# Patient Record
Sex: Female | Born: 1970 | Hispanic: Refuse to answer | Marital: Single
Health system: Midwestern US, Community
[De-identification: ages and names within clinical notes are randomized; demographics above are authoritative.]

## PROBLEM LIST (undated history)

## (undated) DIAGNOSIS — E278 Other specified disorders of adrenal gland: Secondary | ICD-10-CM

## (undated) DIAGNOSIS — J45909 Unspecified asthma, uncomplicated: Secondary | ICD-10-CM

## (undated) DIAGNOSIS — G459 Transient cerebral ischemic attack, unspecified: Secondary | ICD-10-CM

## (undated) DIAGNOSIS — I671 Cerebral aneurysm, nonruptured: Secondary | ICD-10-CM

## (undated) DIAGNOSIS — M79605 Pain in left leg: Principal | ICD-10-CM

## (undated) HISTORY — PX: CARDIAC CATHETERIZATION: SHX172

## (undated) HISTORY — PX: CHOLECYSTECTOMY: SHX55

---

## 2014-08-04 ENCOUNTER — Emergency Department: Admit: 2014-08-05 | Payer: Self-pay

## 2014-08-04 ENCOUNTER — Inpatient Hospital Stay
Admit: 2014-08-04 | Discharge: 2014-08-05 | Disposition: A | Discharge status: Home / Self Care | Payer: Self-pay | Attending: Emergency Medicine

## 2014-08-04 DIAGNOSIS — M769 Unspecified enthesopathy, lower limb, excluding foot: Secondary | ICD-10-CM

## 2014-08-04 NOTE — ED Notes (Signed)
C/o R hip pain x2 days, worsened this AM when rolling over in bed felt pop in R hip

## 2014-08-04 NOTE — ED Provider Notes (Signed)
HPI Comments:   8:43 PM   Judy Campbell is a 43 y.o. female presenting to the ED C/O R hip pain onset 2 days ago.   "It feels like it's on fire." Pt says rotating the R hip worsens the pain. Notes that when she is walking, she experiences a "popping" sensation. 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 Reports of working for the past 2 days. Pt states of taking Motrin 600mg  from her doctor in [place] for general pain. Pt denies abd pain, hx of appendicitis, trauma, and any other Sx or complaints.        Patient is a 43 y.o. female presenting with hip pain. The history is provided by the patient. No language interpreter was used.   Hip Injury   This is a new problem. The current episode started 2 days ago. The problem has not changed since onset.The pain is present in the right hip.        Past Medical History:   Diagnosis Date   ??? DVT (deep venous thrombosis) (HCC)      L calf   ??? PE (pulmonary embolism) (HCC)    ??? MI (myocardial infarction) (HCC)    ??? Stroke [facility])        Past Surgical History:   Procedure Laterality Date   ??? Hx heart catheterization       2000,2011   ??? Hx cesarean section       1991   ??? Hx pelvic laparoscopy       4         History reviewed. No pertinent family history.    History     Social History   ??? Marital Status: SINGLE     Spouse Name: N/A     Number of Children: N/A   ??? Years of Education: N/A     Occupational History   ??? Not on file.     Social History Main Topics   ??? Smoking status: Never Smoker    ??? Smokeless tobacco: Not on file   ??? Alcohol Use: Not on file   ??? Drug Use: Not on file   ??? Sexual Activity: Not on file     Other Topics Concern   ??? Not on file     Social History Narrative   ??? No narrative on file     ALLERGIES: Amoxicillin and Bactrim      Review of Systems   Constitutional: Negative for fever and fatigue.   HENT: Negative for rhinorrhea and sore throat.    Respiratory: Negative for cough and shortness of breath.     Cardiovascular: Negative for chest pain and palpitations.   Gastrointestinal: Negative for nausea, vomiting, abdominal pain and diarrhea.   Genitourinary: Negative for dysuria and difficulty urinating.   Musculoskeletal: Positive for myalgias and arthralgias.   Skin: Negative for color change and rash.   Neurological: Negative for light-headedness and headaches.   All other systems reviewed and are negative.      Filed Vitals:    08/04/14 1914   BP: 121/79   Pulse: 79   Temp: 98.3 ??F (36.8 ??C)   Resp: 14   Height: 5\' 4"  (1.626 m)   Weight: 83.915 kg (185 lb)   SpO2: 97%            Physical Exam   Constitutional: She appears well-developed and well-nourished. No distress.   Neck: Normal range of motion.  Cardiovascular: Normal rate.    Pulmonary/Chest: Effort normal and breath sounds normal.   Abdominal: Soft. There is no tenderness.   Musculoskeletal:        Right hip: She exhibits tenderness and deformity. She exhibits normal range of motion, normal strength, no bony tenderness and no swelling.   Pain increases with abduction and flexion as well as internal rotation.   Neurological: No cranial nerve deficit or sensory deficit.   Skin: No rash noted.   Nursing note and vitals reviewed.       RESULTS:    8:37 PM  RADIOLOGY FINDINGS  RT HIP X-ray shows nothing acute  Pending review by Radiologist  Recorded by Farrell Ourshelsea Rubis, ED Scribe, as dictated by Osborne OmanSarah Danity Schmelzer, PA-C      XR HIP RT AP/LAT MIN 2 V    (Results Pending)        Labs Reviewed - No data to display    No results found for this or any previous visit (from the past 12 hour(s)).     MDM    MEDICATIONS GIVEN:  Medications   HYDROcodone-acetaminophen (NORCO) 5-325 mg per tablet 1 Tab (1 Tab Oral Given 08/04/14 2057)        Procedures       PROGRESS NOTE:  8:26 PM   Initial assessment performed.  Written by Farrell Ourshelsea Rubis, ED Scribe, as dictated by Osborne OmanSarah Bali Lyn, PA-C     DISCHARGE NOTE:   8:51 PM     Neoma LamingHolly Diane Crutcher's results have been reviewed with patient and/or family. Patient and/or family has been counseled regarding diagnosis, treatment, and plan.  Patient and/or family verbally conveys understanding and agreement of the signs, symptoms, diagnosis, treatment and prognosis and additionally agrees to follow up as discussed.  Patient and/or family also agrees with the care-plan and conveys that all of his/her questions have been answered.  I have also provided discharge instructions for the patient and/or family that include: educational information regarding their diagnosis and treatment, and list of reasons why they would want to return to the ED prior to their follow-up appointment, should patient's condition change.     CLINICAL IMPRESSION    1. Hip flexor tendonitis, right           AFTER VISIT PLAN    Follow-up Information     Follow up With Details Comments Contact Info    Luna GlasgowBoyd W Haynes III, MD Call in 1 day orthopedist [address]  [place] [id number]  [phone]      [facility]  As needed, If symptoms worsen [address]  [place] [id number]  [phone]          Discharge Medication List as of 08/04/2014  8:53 PM      START taking these medications    Details   baclofen (LIORESAL) 10 mg tablet Take 1 Tab by mouth three (3) times daily., Print, Disp-20 Tab, R-0      HYDROcodone-acetaminophen (NORCO) 5-325 mg per tablet Take 1 Tab by mouth every six (6) hours as needed for Pain. Max Daily Amount: 4 Tabs., Print, Disp-20 Tab, R-0         CONTINUE these medications which have CHANGED    Details   ibuprofen (MOTRIN) 600 mg tablet Take 1 Tab by mouth every six (6) hours as needed for Pain., Print, Disp-30 Tab, R-0         CONTINUE these medications which have NOT CHANGED    Details  albuterol (PROVENTIL HFA, VENTOLIN HFA, PROAIR HFA) 90 mcg/actuation inhaler Take  by inhalation., Historical Med      RANITIDINE HCL PO Take  by mouth., Historical Med       aspirin 81 mg chewable tablet Take 81 mg by mouth daily., Historical Med             Written by Farrell Ourshelsea Rubis, ED Scribe, as dictated by Osborne OmanSarah Daisia Slomski, PA-C     I agree with the above documentation as written by the scribe.  Osborne OmanSarah Jayshaun Phillips, PA-C

## 2014-08-04 NOTE — ED Notes (Signed)
Pain level at discharge 5/10.  Pt discharged with family member.  Reviewed discharged instructions with patient who verbalized understanding.  Patient armband removed and shredded

## 2014-08-04 NOTE — ED Notes (Signed)
Patient reports pain in R hip since this morning, patient has husband at bedside, A&Ox4, respirations even and unlabored.

## 2014-08-05 MED ORDER — HYDROCODONE-ACETAMINOPHEN 5 MG-325 MG TAB
5-325 mg | ORAL_TABLET | Freq: Four times a day (QID) | ORAL | Status: DC | PRN
Start: 2014-08-05 — End: 2014-09-10

## 2014-08-05 MED ORDER — HYDROCODONE-ACETAMINOPHEN 5 MG-325 MG TAB
5-325 mg | ORAL | Status: AC
Start: 2014-08-05 — End: 2014-08-04
  Administered 2014-08-05: 02:00:00 via ORAL

## 2014-08-05 MED ORDER — BACLOFEN 10 MG TAB
10 mg | ORAL_TABLET | Freq: Three times a day (TID) | ORAL | Status: DC
Start: 2014-08-05 — End: 2014-09-10

## 2014-08-05 MED ORDER — IBUPROFEN 600 MG TAB
600 mg | ORAL_TABLET | Freq: Four times a day (QID) | ORAL | Status: DC | PRN
Start: 2014-08-05 — End: 2014-09-10

## 2014-08-05 MED FILL — HYDROCODONE-ACETAMINOPHEN 5 MG-325 MG TAB: 5-325 mg | ORAL | Qty: 1

## 2014-09-10 ENCOUNTER — Inpatient Hospital Stay
Admit: 2014-09-10 | Discharge: 2014-09-10 | Disposition: A | Discharge status: Home / Self Care | Payer: Self-pay | Attending: Emergency Medicine

## 2014-09-10 ENCOUNTER — Emergency Department: Admit: 2014-09-10 | Payer: Self-pay

## 2014-09-10 DIAGNOSIS — R109 Unspecified abdominal pain: Secondary | ICD-10-CM

## 2014-09-10 LAB — CBC WITH AUTOMATED DIFF
ABS. BASOPHILS: 0 10*3/uL (ref 0.0–0.06)
ABS. EOSINOPHILS: 0.1 10*3/uL (ref 0.0–0.4)
ABS. LYMPHOCYTES: 1.5 10*3/uL (ref 0.9–3.6)
ABS. MONOCYTES: 0.3 10*3/uL (ref 0.05–1.2)
ABS. NEUTROPHILS: 4.5 10*3/uL (ref 1.8–8.0)
BASOPHILS: 1 % (ref 0–2)
EOSINOPHILS: 1 % (ref 0–5)
HCT: 42.7 % (ref 35.0–45.0)
HGB: 14.8 g/dL (ref 12.0–16.0)
LYMPHOCYTES: 23 % (ref 21–52)
MCH: 29.7 PG (ref 24.0–34.0)
MCHC: 34.7 g/dL (ref 31.0–37.0)
MCV: 85.7 FL (ref 74.0–97.0)
MONOCYTES: 4 % (ref 3–10)
MPV: 9.1 FL — ABNORMAL LOW (ref 9.2–11.8)
NEUTROPHILS: 71 % (ref 40–73)
PLATELET: 311 10*3/uL (ref 135–420)
RBC: 4.98 M/uL (ref 4.20–5.30)
RDW: 12.4 % (ref 11.6–14.5)
WBC: 6.4 10*3/uL (ref 4.6–13.2)

## 2014-09-10 LAB — METABOLIC PANEL, COMPREHENSIVE
A-G Ratio: 1 (ref 0.8–1.7)
ALT (SGPT): 17 U/L (ref 13–56)
AST (SGOT): 7 U/L — ABNORMAL LOW (ref 15–37)
Albumin: 3.3 g/dL — ABNORMAL LOW (ref 3.4–5.0)
Alk. phosphatase: 49 U/L (ref 45–117)
Anion gap: 8 mmol/L (ref 3.0–18)
BUN/Creatinine ratio: 16 (ref 12–20)
BUN: 13 MG/DL (ref 7.0–18)
Bilirubin, total: 0.4 MG/DL (ref 0.2–1.0)
CO2: 27 mmol/L (ref 21–32)
Calcium: 8.2 MG/DL — ABNORMAL LOW (ref 8.5–10.1)
Chloride: 107 mmol/L (ref 100–108)
Creatinine: 0.79 MG/DL (ref 0.6–1.3)
GFR est AA: >60 mL/min/{1.73_m2} (ref >60)
GFR est non-AA: >60 mL/min/{1.73_m2} (ref >60)
Globulin: 3.2 g/dL (ref 2.0–4.0)
Glucose: 120 mg/dL — ABNORMAL HIGH (ref 74–99)
Potassium: 3.7 mmol/L (ref 3.5–5.5)
Protein, total: 6.5 g/dL (ref 6.4–8.2)
Sodium: 142 mmol/L (ref 136–145)

## 2014-09-10 LAB — URINALYSIS W/ RFLX MICROSCOPIC
Bilirubin: NEGATIVE
Blood: NEGATIVE
Glucose: NEGATIVE mg/dL
Ketone: NEGATIVE mg/dL
Leukocyte Esterase: NEGATIVE
Nitrites: NEGATIVE
Protein: NEGATIVE mg/dL
Specific gravity: 1.028 (ref 1.005–1.030)
Urobilinogen: 1 EU/dL (ref 0.2–1.0)
pH (UA): 7 (ref 5.0–8.0)

## 2014-09-10 LAB — LIPASE: Lipase: 110 U/L (ref 73–393)

## 2014-09-10 LAB — HCG URINE, QL: HCG urine, QL: NEGATIVE

## 2014-09-10 MED ORDER — TRAMADOL 50 MG TAB
50 mg | ORAL_TABLET | Freq: Four times a day (QID) | ORAL | Status: AC | PRN
Start: 2014-09-10 — End: ?

## 2014-09-10 MED ORDER — SODIUM CHLORIDE 0.9% BOLUS IV
0.9 % | Freq: Once | INTRAVENOUS | Status: AC
Start: 2014-09-10 — End: 2014-09-10
  Administered 2014-09-10: 14:00:00 via INTRAVENOUS

## 2014-09-10 MED ORDER — SODIUM CHLORIDE 0.9 % IJ SYRG
INTRAMUSCULAR | Status: DC | PRN
Start: 2014-09-10 — End: 2014-09-10

## 2014-09-10 MED ORDER — PANTOPRAZOLE 40 MG IV SOLR
40 mg | INTRAVENOUS | Status: AC
Start: 2014-09-10 — End: 2014-09-10
  Administered 2014-09-10: 16:00:00 via INTRAVENOUS

## 2014-09-10 MED ORDER — DICYCLOMINE 20 MG TAB
20 mg | ORAL_TABLET | Freq: Four times a day (QID) | ORAL | Status: AC
Start: 2014-09-10 — End: 2014-09-15

## 2014-09-10 MED ORDER — ONDANSETRON (PF) 4 MG/2 ML INJECTION
4 mg/2 mL | Freq: Once | INTRAMUSCULAR | Status: AC
Start: 2014-09-10 — End: 2014-09-10
  Administered 2014-09-10: 14:00:00 via INTRAVENOUS

## 2014-09-10 MED FILL — ONDANSETRON (PF) 4 MG/2 ML INJECTION: 4 mg/2 mL | INTRAMUSCULAR | Qty: 2

## 2014-09-10 MED FILL — PROTONIX 40 MG INTRAVENOUS SOLUTION: 40 mg | INTRAVENOUS | Qty: 40

## 2014-09-10 MED FILL — SODIUM CHLORIDE 0.9 % IV: INTRAVENOUS | Qty: 1000

## 2014-09-10 MED FILL — BD POSIFLUSH NORMAL SALINE 0.9 % INJECTION SYRINGE: INTRAMUSCULAR | Qty: 10

## 2014-09-10 NOTE — ED Notes (Addendum)
C/o right flank pain for 3 days, pain now radiates to upper abd area since yesterday, nausea yesterday, black stool this morning.  Sepsis Screening completed    (  )Patient meets SIRS criteria.  (x  )Patient does not meet SIRS criteria.      SIRS Criteria is achieved when two or more of the following are present  ? Temperature < 96.8??F (36??C) or > 100.9??F (38.3??C)  ? Heart Rate > 90 beats per minute  ? Respiratory Rate > 20 beats per minute  ? WBC count > 12,000 or <4,000 or > 10% bands      (  )Patient has a suspected source of infection.  (x  )Patient does not have a suspected source of infection.

## 2014-09-10 NOTE — ED Notes (Signed)
I have reviewed discharge instructions with the patient.  The patient verbalized understanding. Patient armband removed and shredded

## 2014-09-10 NOTE — ED Provider Notes (Signed)
HPI Comments:   8:58 AM   Judy Campbell is a 44 y.o. female presenting to the ED C/O cramping right-sided back pain x 3 days, radiating to the upper abdomen starting yesterday.  Pt also complains of nausea and vomiting 3 days ago (resolved now), fatigue, generalized weakness, chills, and black stool starting yesterday.  LMP started yesterday.  Pt was seen at [facility] recently for a pregnancy test (says it was negative).  PMHx includes GERD and diverticulitis.  Pt takes Ranitidine daily.  PSHx includes cholecystectomy and heart catheterization.  Pt denies use of cigarettes or EtOH; hematemesis, and any other symptoms or complaints.    Written by Donne Anon, ED Scribe, as dictated by Dois Davenport, PA-C      Patient is a 44 y.o. female presenting with flank pain. The history is provided by the patient. No language interpreter was used.   Flank Pain   This is a new problem. The current episode started more than 2 days ago (3 days ago). The quality of the pain is described as cramping. Associated symptoms include abdominal pain and weakness (generalized). Pertinent negatives include no chest pain, no fever, no headaches and no dysuria.        Past Medical History:   Diagnosis Date   ??? DVT (deep venous thrombosis) (HCC)      L calf   ??? PE (pulmonary embolism) ([place])    ??? MI (myocardial infarction) ([place])    ??? Stroke [facility])    ??? Hypertension    ??? Asthma    ??? Ovarian cyst    ??? Migraine    ??? Uterine fibroid        Past Surgical History:   Procedure Laterality Date   ??? Hx heart catheterization       2000,2011   ??? Hx cesarean section       1991   ??? Hx pelvic laparoscopy       4   ??? Hx other surgical       cyst removed from throat         No family history on file.    History     Social History   ??? Marital Status: SINGLE     Spouse Name: N/A     Number of Children: N/A   ??? Years of Education: N/A     Occupational History   ??? Not on file.     Social History Main Topics   ??? Smoking status: Never Smoker     ??? Smokeless tobacco: Not on file   ??? Alcohol Use: Not on file   ??? Drug Use: Not on file   ??? Sexual Activity: Not on file     Other Topics Concern   ??? Not on file     Social History Narrative       ALLERGIES: Amoxicillin and Bactrim      Review of Systems   Constitutional: Positive for chills and fatigue. Negative for fever.   HENT: Negative for rhinorrhea and sore throat.    Respiratory: Negative for cough and shortness of breath.    Cardiovascular: Negative for chest pain and palpitations.   Gastrointestinal: Positive for nausea (resolved), vomiting (resolved) and abdominal pain. Negative for diarrhea.        +black stool   Genitourinary: Negative for dysuria and difficulty urinating.   Musculoskeletal: Positive for back pain. Negative for myalgias and arthralgias.   Skin: Negative for color change and rash.   Neurological: Positive  for weakness (generalized). Negative for light-headedness and headaches.       Filed Vitals:    09/10/14 0856   BP: 116/49   Pulse: 73   Temp: 98.2 ??F (36.8 ??C)   Resp: 14   Height: $Remove'5\' 4"'[id number]$  (1.626 m)   Weight: 88.451 kg (195 lb)   SpO2: 99%            Physical Exam   Constitutional: She is oriented to person, place, and time. She appears well-developed and well-nourished. No distress.   HENT:   Head: Normocephalic and atraumatic.   Right Ear: Tympanic membrane, external ear and ear canal normal.   Left Ear: Tympanic membrane, external ear and ear canal normal.   Nose: Nose normal.   Mouth/Throat: Uvula is midline, oropharynx is clear and moist and mucous membranes are normal. No oropharyngeal exudate, posterior oropharyngeal edema or posterior oropharyngeal erythema.   Eyes: EOM are normal. Pupils are equal, round, and reactive to light. Right eye exhibits no discharge. Left eye exhibits no discharge.   Neck: Trachea normal, normal range of motion and full passive range of motion without pain. Neck supple. No rigidity.   Cardiovascular: Normal rate, regular rhythm, normal heart sounds and  normal pulses.  Exam reveals no gallop and no friction rub.    No murmur heard.  Pulmonary/Chest: Effort normal and breath sounds normal. No respiratory distress. She has no wheezes. She has no rales. She exhibits no tenderness.   Abdominal: Soft. Bowel sounds are normal. She exhibits no distension and no mass. There is no tenderness. There is no rebound and no guarding.   Genitourinary: Rectum normal. Guaiac negative stool.   Musculoskeletal: Normal range of motion.   Lymphadenopathy:     She has no cervical adenopathy.   Neurological: She is alert and oriented to person, place, and time.   Skin: Skin is warm and dry. No rash noted. She is not diaphoretic.   Psychiatric: She has a normal mood and affect. Judgment normal.   Nursing note and vitals reviewed.       RESULTS:    CT ABD PELV WO CONT   Final Result   1.?? No nephrolithiasis, distal ureteral stone, or bladder stone. No  hydronephrosis present.  2. No bowel obstruction or perforation. Normal appendix.  3. Surgical changes of cholecystectomy without intrahepatic or extrahepatic  biliary ductal dilatation.  As read by the radiologist.         Labs Reviewed   CBC WITH AUTOMATED DIFF - Abnormal; Notable for the following:     MPV 9.1 (*)     All other components within normal limits   METABOLIC PANEL, COMPREHENSIVE - Abnormal; Notable for the following:     Glucose 120 (*)     Calcium 8.2 (*)     AST 7 (*)     Albumin 3.3 (*)     All other components within normal limits   URINALYSIS W/ RFLX MICROSCOPIC   LIPASE   HCG URINE, QL   POC FECAL OCCULT BLOOD       Recent Results (from the past 12 hour(s))   CBC WITH AUTOMATED DIFF    Collection Time: 09/10/14  9:02 AM   Result Value Ref Range    WBC 6.4 4.6 - 13.2 K/uL    RBC 4.98 4.20 - 5.30 M/uL    HGB 14.8 12.0 - 16.0 g/dL    HCT 42.7 35.0 - 45.0 %    MCV 85.7 74.0 - 97.0 FL  MCH 29.7 24.0 - 34.0 PG    MCHC 34.7 31.0 - 37.0 g/dL    RDW 12.4 11.6 - 14.5 %    PLATELET 311 135 - 420 K/uL     MPV 9.1 (L) 9.2 - 11.8 FL    NEUTROPHILS 71 40 - 73 %    LYMPHOCYTES 23 21 - 52 %    MONOCYTES 4 3 - 10 %    EOSINOPHILS 1 0 - 5 %    BASOPHILS 1 0 - 2 %    ABS. NEUTROPHILS 4.5 1.8 - 8.0 K/UL    ABS. LYMPHOCYTES 1.5 0.9 - 3.6 K/UL    ABS. MONOCYTES 0.3 0.05 - 1.2 K/UL    ABS. EOSINOPHILS 0.1 0.0 - 0.4 K/UL    ABS. BASOPHILS 0.0 0.0 - 0.06 K/UL    DF AUTOMATED     METABOLIC PANEL, COMPREHENSIVE    Collection Time: 09/10/14  9:02 AM   Result Value Ref Range    Sodium 142 136 - 145 mmol/L    Potassium 3.7 3.5 - 5.5 mmol/L    Chloride 107 100 - 108 mmol/L    CO2 27 21 - 32 mmol/L    Anion gap 8 3.0 - 18 mmol/L    Glucose 120 (H) 74 - 99 mg/dL    BUN 13 7.0 - 18 MG/DL    Creatinine 0.79 0.6 - 1.3 MG/DL    BUN/Creatinine ratio 16 12 - 20      GFR est AA >60 >60 ml/min/1.35m2    GFR est non-AA >60 >60 ml/min/1.1m2    Calcium 8.2 (L) 8.5 - 10.1 MG/DL    Bilirubin, total 0.4 0.2 - 1.0 MG/DL    ALT 17 13 - 56 U/L    AST 7 (L) 15 - 37 U/L    Alk. phosphatase 49 45 - 117 U/L    Protein, total 6.5 6.4 - 8.2 g/dL    Albumin 3.3 (L) 3.4 - 5.0 g/dL    Globulin 3.2 2.0 - 4.0 g/dL    A-G Ratio 1.0 0.8 - 1.7     LIPASE    Collection Time: 09/10/14  9:02 AM   Result Value Ref Range    Lipase 110 73 - 393 U/L   URINALYSIS W/ RFLX MICROSCOPIC    Collection Time: 09/10/14  9:30 AM   Result Value Ref Range    Color YELLOW      Appearance CLEAR      Specific gravity 1.028 1.005 - 1.030      pH (UA) 7.0 5.0 - 8.0      Protein NEGATIVE  NEG mg/dL    Glucose NEGATIVE  NEG mg/dL    Ketone NEGATIVE  NEG mg/dL    Bilirubin NEGATIVE  NEG      Blood NEGATIVE  NEG      Urobilinogen 1.0 0.2 - 1.0 EU/dL    Nitrites NEGATIVE  NEG      Leukocyte Esterase NEGATIVE  NEG     HCG URINE, QL    Collection Time: 09/10/14  9:30 AM   Result Value Ref Range    HCG urine, Ql. NEGATIVE  NEG          MDM  Number of Diagnoses or Management Options     Amount and/or Complexity of Data Reviewed  Clinical lab tests: ordered and reviewed   Tests in the radiology section of CPT??: ordered and reviewed (CT abdomen)  Review and summarize past medical records: yes  MEDICATIONS GIVEN:  Medications   sodium chloride (NS) flush 5-10 mL (not administered)   sodium chloride 0.9 % bolus infusion 1,000 mL (0 mL IntraVENous IV Completed 09/10/14 1038)   ondansetron (ZOFRAN) injection 4 mg (4 mg IntraVENous Given 09/10/14 0928)   pantoprazole (PROTONIX) injection 40 mg (40 mg IntraVENous Given 09/10/14 1122)       Procedures    PROGRESS NOTE:  8:58 AM  Initial assessment performed.  Written by Donne Anon, ED Scribe, as dictated by Dois Davenport, PA-C     PROCEDURE NOTE - RECTAL EXAM:   9:00 AM  Performed by: Dois Davenport, PA-C  Rectal exam performed.  Stool was Hemoccult tested, and found to be heme Negative.   The procedure took 1-15 minutes, and pt tolerated well.  Written by Elby Showers, ED Scribe, as dictated by Dois Davenport, PA-C.      DISCHARGE NOTE:  11:41 AM    Judy Campbell's  results have been reviewed with her.  She has been counseled regarding her diagnosis, treatment, and plan.  She verbally conveys understanding and agreement of the signs, symptoms, diagnosis, treatment and prognosis and additionally agrees to follow up as discussed.  She also agrees with the care-plan and conveys that all of her questions have been answered.  I have also provided discharge instructions for her that include: educational information regarding their diagnosis and treatment, and list of reasons why they would want to return to the ED prior to their follow-up appointment, should her condition change.      CLINICAL IMPRESSION:    1. Right flank pain        PLAN:  1. D/C Home  2.   Current Discharge Medication List      START taking these medications    Details   traMADol (ULTRAM) 50 mg tablet Take 1 Tab by mouth every six (6) hours as needed for Pain. Max Daily Amount: 200 mg.  Qty: 12 Tab, Refills: 0       dicyclomine (BENTYL) 20 mg tablet Take 1 Tab by mouth every six (6) hours for 20 doses.  Qty: 20 Tab, Refills: 0         CONTINUE these medications which have NOT CHANGED    Details   RANITIDINE HCL PO Take  by mouth.      aspirin 81 mg chewable tablet Take 81 mg by mouth daily.           3.   Follow-up Information     Follow up With Details Comments Contact Info    [place] Call in 2 days For follow up appointment  [place], [place]    [facility] EMERGENCY DEPT  As needed, If symptoms worsen [address]  [place] [address]  [phone]          Written by Donne Anon, ED Scribe, as dictated by Dois Davenport, PA-C.      I agree with the above documentation as written by the scribe.   Dois Davenport, PA-C

## 2017-11-08 ENCOUNTER — Encounter: Attending: Legal Medicine

## 2018-01-10 ENCOUNTER — Observation Stay: Payer: Medicaid Other

## 2018-01-10 ENCOUNTER — Other Ambulatory Visit: Payer: Self-pay

## 2018-01-10 ENCOUNTER — Emergency Department: Payer: Medicaid Other

## 2018-01-10 ENCOUNTER — Observation Stay
Admission: EM | Admit: 2018-01-10 | Discharge: 2018-01-11 | Disposition: A | Discharge status: Home / Self Care | Payer: Medicaid Other | Attending: Internal Medicine | Admitting: Internal Medicine

## 2018-01-10 ENCOUNTER — Encounter: Payer: Self-pay | Admitting: Emergency Medicine

## 2018-01-10 DIAGNOSIS — Z7982 Long term (current) use of aspirin: Secondary | ICD-10-CM | POA: Insufficient documentation

## 2018-01-10 DIAGNOSIS — Z8673 Personal history of transient ischemic attack (TIA), and cerebral infarction without residual deficits: Secondary | ICD-10-CM | POA: Insufficient documentation

## 2018-01-10 DIAGNOSIS — J45909 Unspecified asthma, uncomplicated: Secondary | ICD-10-CM | POA: Insufficient documentation

## 2018-01-10 DIAGNOSIS — E669 Obesity, unspecified: Secondary | ICD-10-CM | POA: Insufficient documentation

## 2018-01-10 DIAGNOSIS — G43909 Migraine, unspecified, not intractable, without status migrainosus: Secondary | ICD-10-CM | POA: Insufficient documentation

## 2018-01-10 DIAGNOSIS — Z9049 Acquired absence of other specified parts of digestive tract: Secondary | ICD-10-CM | POA: Insufficient documentation

## 2018-01-10 DIAGNOSIS — Z881 Allergy status to other antibiotic agents status: Secondary | ICD-10-CM | POA: Insufficient documentation

## 2018-01-10 DIAGNOSIS — Z880 Allergy status to penicillin: Secondary | ICD-10-CM | POA: Insufficient documentation

## 2018-01-10 DIAGNOSIS — Z79899 Other long term (current) drug therapy: Secondary | ICD-10-CM | POA: Insufficient documentation

## 2018-01-10 DIAGNOSIS — Z8249 Family history of ischemic heart disease and other diseases of the circulatory system: Secondary | ICD-10-CM | POA: Insufficient documentation

## 2018-01-10 DIAGNOSIS — Z7951 Long term (current) use of inhaled steroids: Secondary | ICD-10-CM | POA: Insufficient documentation

## 2018-01-10 DIAGNOSIS — Z8679 Personal history of other diseases of the circulatory system: Secondary | ICD-10-CM | POA: Insufficient documentation

## 2018-01-10 DIAGNOSIS — G459 Transient cerebral ischemic attack, unspecified: Principal | ICD-10-CM | POA: Insufficient documentation

## 2018-01-10 DIAGNOSIS — Z6836 Body mass index (BMI) 36.0-36.9, adult: Secondary | ICD-10-CM | POA: Insufficient documentation

## 2018-01-10 HISTORY — DX: Transient cerebral ischemic attack, unspecified: G45.9

## 2018-01-10 HISTORY — DX: Cerebral aneurysm, nonruptured: I67.1

## 2018-01-10 HISTORY — DX: Other specified disorders of adrenal gland: E27.8

## 2018-01-10 HISTORY — DX: Unspecified asthma, uncomplicated: J45.909

## 2018-01-10 LAB — COMPREHENSIVE METABOLIC PANEL
ALK PHOS: 35 U/L — AB (ref 38–126)
ALT: 16 U/L (ref 14–54)
AST: 15 U/L (ref 15–41)
Albumin: 3.5 g/dL (ref 3.5–5.0)
Anion gap: 4 — ABNORMAL LOW (ref 5–15)
BUN: 15 mg/dL (ref 6–20)
CHLORIDE: 107 mmol/L (ref 101–111)
CO2: 27 mmol/L (ref 22–32)
CREATININE: 0.71 mg/dL (ref 0.44–1.00)
Calcium: 9 mg/dL (ref 8.9–10.3)
GFR calc Af Amer: >60 mL/min (ref >60)
GFR calc non Af Amer: >60 mL/min (ref >60)
Glucose, Bld: 109 mg/dL — ABNORMAL HIGH (ref 65–99)
Potassium: 3.7 mmol/L (ref 3.5–5.1)
SODIUM: 138 mmol/L (ref 135–145)
Total Bilirubin: 0.6 mg/dL (ref 0.3–1.2)
Total Protein: 5.8 g/dL — ABNORMAL LOW (ref 6.5–8.1)

## 2018-01-10 LAB — CBC
HCT: 38.5 % (ref 35.0–47.0)
Hemoglobin: 13.5 g/dL (ref 12.0–16.0)
MCH: 29.7 pg (ref 26.0–34.0)
MCHC: 35.1 g/dL (ref 32.0–36.0)
MCV: 84.6 fL (ref 80.0–100.0)
PLATELETS: 334 10*3/uL (ref 150–440)
RBC: 4.55 MIL/uL (ref 3.80–5.20)
RDW: 13 % (ref 11.5–14.5)
WBC: 6.9 10*3/uL (ref 3.6–11.0)

## 2018-01-10 LAB — POC URINE PREG, ED: Preg Test, Ur: NEGATIVE

## 2018-01-10 MED ORDER — STROKE: EARLY STAGES OF RECOVERY BOOK
Freq: Once | Status: AC
  Administered 2018-01-10: 19:00:00

## 2018-01-10 MED ORDER — FAMOTIDINE 20 MG PO TABS
10.0000 mg | ORAL_TABLET | Freq: Every day | ORAL | Status: DC
  Administered 2018-01-10 – 2018-01-11 (×2): 10 mg via ORAL
  Filled 2018-01-10 (×2): qty 1

## 2018-01-10 MED ORDER — SENNOSIDES-DOCUSATE SODIUM 8.6-50 MG PO TABS: 1.0000 | ORAL_TABLET | Freq: Every evening | ORAL | Status: DC | PRN

## 2018-01-10 MED ORDER — ENOXAPARIN SODIUM 40 MG/0.4ML ~~LOC~~ SOLN
40.0000 mg | SUBCUTANEOUS | Status: DC
  Filled 2018-01-10: qty 0.4

## 2018-01-10 MED ORDER — ACETAMINOPHEN 160 MG/5ML PO SOLN
650.0000 mg | ORAL | Status: DC | PRN
  Filled 2018-01-10: qty 20.3

## 2018-01-10 MED ORDER — IPRATROPIUM-ALBUTEROL 0.5-2.5 (3) MG/3ML IN SOLN: 3.0000 mL | RESPIRATORY_TRACT | Status: DC | PRN

## 2018-01-10 MED ORDER — ALBUTEROL SULFATE (2.5 MG/3ML) 0.083% IN NEBU
2.5000 mg | INHALATION_SOLUTION | Freq: Four times a day (QID) | RESPIRATORY_TRACT | Status: DC
  Administered 2018-01-10: 19:00:00 2.5 mg via RESPIRATORY_TRACT

## 2018-01-10 MED ORDER — ACETAMINOPHEN ER 650 MG PO TBCR: 650.0000 mg | EXTENDED_RELEASE_TABLET | Freq: Three times a day (TID) | ORAL | Status: DC | PRN

## 2018-01-10 MED ORDER — ACETAMINOPHEN 325 MG PO TABS
650.0000 mg | ORAL_TABLET | ORAL | Status: DC | PRN
  Administered 2018-01-10: 20:00:00 650 mg via ORAL
  Filled 2018-01-10: qty 2

## 2018-01-10 MED ORDER — BUTALBITAL-APAP-CAFFEINE 50-325-40 MG PO TABS
1.0000 | ORAL_TABLET | Freq: Four times a day (QID) | ORAL | Status: DC | PRN
  Filled 2018-01-10: qty 1

## 2018-01-10 MED ORDER — IOPAMIDOL (ISOVUE-370) INJECTION 76%
75.0000 mL | Freq: Once | INTRAVENOUS | Status: AC | PRN
  Administered 2018-01-10: 75 mL via INTRAVENOUS

## 2018-01-10 MED ORDER — TRAMADOL HCL 50 MG PO TABS: 50.0000 mg | ORAL_TABLET | ORAL | Status: DC | PRN

## 2018-01-10 MED ORDER — DICLOFENAC SODIUM 75 MG PO TBEC
75.0000 mg | DELAYED_RELEASE_TABLET | Freq: Two times a day (BID) | ORAL | Status: DC
  Administered 2018-01-10 – 2018-01-11 (×2): 75 mg via ORAL
  Filled 2018-01-10 (×3): qty 1

## 2018-01-10 MED ORDER — ASPIRIN EC 81 MG PO TBEC
81.0000 mg | DELAYED_RELEASE_TABLET | Freq: Every day | ORAL | Status: DC
  Administered 2018-01-10 – 2018-01-11 (×2): 81 mg via ORAL
  Filled 2018-01-10 (×2): qty 1

## 2018-01-10 MED ORDER — ATORVASTATIN CALCIUM 20 MG PO TABS
20.0000 mg | ORAL_TABLET | Freq: Every day | ORAL | Status: DC
  Administered 2018-01-10: 20 mg via ORAL
  Filled 2018-01-10: qty 1

## 2018-01-10 MED ORDER — ACETAMINOPHEN 650 MG RE SUPP: 650.0000 mg | RECTAL | Status: DC | PRN

## 2018-01-10 MED ORDER — ONDANSETRON HCL 4 MG/2ML IJ SOLN
4.0000 mg | Freq: Once | INTRAMUSCULAR | Status: AC
  Administered 2018-01-10: 4 mg via INTRAVENOUS
  Filled 2018-01-10: qty 2

## 2018-01-10 MED ORDER — ALBUTEROL SULFATE (2.5 MG/3ML) 0.083% IN NEBU
2.5000 mg | INHALATION_SOLUTION | Freq: Four times a day (QID) | RESPIRATORY_TRACT | Status: DC
  Filled 2018-01-10: qty 3

## 2018-01-10 NOTE — ED Triage Notes (Signed)
Pt to the ED via POV stating that since 0300 she has been having pain behind her left eye, left sided facial numbness, headache, nausea, and light sensitivity. Pt has hx/o migraines, TIA, and brain aneurysm. Pt is in NAD at this time.

## 2018-01-10 NOTE — ED Provider Notes (Addendum)
[facility] Emergency Department Provider Note   ____________________________________________   First MD Initiated Contact with Patient 01/10/18 1058     (approximate)  I have reviewed the triage vital signs and the nursing notes.   HISTORY  Chief Complaint Numbness and Eye Pain    HPI Teresa Whitaker is a 47 y.o. female Who reports she has a history of TIAs and has had a brain aneurysm diagnosed but not treated. She says she was told it was too small and in too dangerous a place to be TREATED. SHE SAYS SHE WOKE UP AT 3:00 THIS MORNING DEVELOPED SEVERE PAIN BEHIND THE LEFT ARM SO THAT SHE COULDN'T MOVE IT and a BAD HEADACHE and  LEFT-SIDED FACIAL NUMBNESS. THE EYE PAIN IS IMPROVED SIGNIFICANTLY SHE'S NOT HAVING ANY PAIN MOVING IT ANYMORE BUT SHE STILL HAVING LEFT-SIDED FACIAL NUMBNESS AND RIGHT HAND NUMBNESS. THERE IS NO WEAKNESS OR ATAXIA. SHE'S HAD THE FACIAL NUMBNESS WITH TIA IN THE PAST. SHE ALSO HAS A HISTORY OF MIGRAINES.she is not having a slurry speech or any other complaints.   Past Medical History:  Diagnosis Date  . Adrenal mass (HCC)   . Asthma   . Brain aneurysm   . TIA (transient ischemic attack)     There are no active problems to display for this patient.   Past Surgical History:  Procedure Laterality Date  . CARDIAC CATHETERIZATION    . CESAREAN SECTION    . CHOLECYSTECTOMY      Prior to Admission medications   Medication Sig Start Date End Date Taking? Authorizing Provider  acetaminophen (TYLENOL) 650 MG CR tablet Take 650 mg by mouth every 8 (eight) hours as needed for pain.   Yes [provider]  albuterol (PROVENTIL HFA;VENTOLIN HFA) 108 (90 Base) MCG/ACT inhaler Inhale 2 puffs into the lungs 4 (four) times daily.   Yes [provider]  aspirin EC 81 MG tablet Take 81-162 mg by mouth daily.   Yes [provider]  aspirin-acetaminophen-caffeine (EXCEDRIN MIGRAINE) 3306391435 MG tablet Take 1-2 tablets  by mouth every 6 (six) hours as needed for headache.   Yes [provider]  Butalbital-APAP-Caffeine (FIORICET) 50-300-40 MG CAPS Take 1 capsule by mouth every 6 (six) hours as needed (headache (max 3 per episode/10 per week)).   Yes [provider]  diclofenac (VOLTAREN) 75 MG EC tablet Take 75 mg by mouth 2 (two) times daily.   Yes [provider]  ranitidine (ZANTAC) 150 MG tablet Take 150 mg by mouth 2 (two) times daily.   Yes [provider]  traMADol (ULTRAM) 50 MG tablet Take 50 mg by mouth every 4 (four) hours as needed for moderate pain.   Yes [provider]    Allergies Amoxicillin and Bactrim [sulfamethoxazole-trimethoprim]  No family history on file.  Social History Social History   Tobacco Use  . Smoking status: Never Smoker  . Smokeless tobacco: Never Used  Substance Use Topics  . Alcohol use: Not Currently  . Drug use: Not Currently    Review of Systems  Constitutional: No fever/chills Eyes: No visual changes. ENT: No sore throat. Cardiovascular: Denies chest pain. Respiratory: Denies shortness of breath. Gastrointestinal: No abdominal pain.  No nausea, no vomiting.  No diarrhea.  No constipation. Genitourinary: Negative for dysuria. Musculoskeletal: Negative for back pain. Skin: Negative for rash. Neurological: Negative for focal weakness   ____________________________________________   PHYSICAL EXAM:  VITAL SIGNS: ED Triage Vitals  Enc Vitals Group  BP 01/10/18 1037 (!) 113/59     Pulse Rate 01/10/18 1037 69     Resp 01/10/18 1037 16     Temp 01/10/18 1037 98.3 F (36.8 C)     Temp Source 01/10/18 1037 Oral     SpO2 01/10/18 1037 96 %     Weight 01/10/18 1038 213 lb (96.6 kg)     Height 01/10/18 1038  (1.626 m)     Head Circumference --      Peak Flow --      Pain Score 01/10/18 1037 4     Pain Loc --      Pain Edu? --      Excl. in GC? --    Constitutional: Alert and oriented. Well  appearing and in no acute distress. Eyes: Conjunctivae are normal. PERRL. EOMI.fundi look normal Head: Atraumatic. Nose: No congestion/rhinnorhea. Mouth/Throat: Mucous membranes are moist.  Oropharynx non-erythematous. Neck: No stridor.   Cardiovascular: Normal rate, regular rhythm. Grossly normal heart sounds.  Good peripheral circulation. Respiratory: Normal respiratory effort.  No retractions. Lungs CTAB. Gastrointestinal: Soft and nontender. No distention. No abdominal bruits. No CVA tenderness. Musculoskeletal: No lower extremity tenderness nor edema.   Neurologic:  Normal speech and language. No gross focal neurologic deficits are appreciated cranial nerves II through XII are intact cerebellar finger-nose rapid alternating movements and hands are normal motor strength is 5 over 5 throughout no palmar drift motor strength in the legs is also normal with no weakness no sensory changes except for in the right hand and the left side of the face. Skin:  Skin is warm, dry and intact. No rash noted. Psychiatric: Mood and affect are normal. Speech and behavior are normal.  ____________________________________________   LABS (all labs ordered are listed, but only abnormal results are displayed)  Labs Reviewed  COMPREHENSIVE METABOLIC PANEL - Abnormal; Notable for the following components:      Result Value   Glucose, Bld 109 (*)    Total Protein 5.8 (*)    Alkaline Phosphatase 35 (*)    Anion gap 4 (*)    All other components within normal limits  CBC  POC URINE PREG, ED   ____________________________________________  EKG  EKG read and interpreted by me shows normal sinus rhythm rate of 66 normal axis no acute changes computer is reading a junctional rhythm but there are P waves in lead V2 ____________________________________________  RADIOLOGY  ED MD interpretation:    Official radiology report(s): Ct Angio Head W Or Wo Contrast  Result Date: 01/10/2018 CLINICAL DATA:  Pain  behind the LEFT eye since earlier today. LEFT-sided facial numbness with headache, nausea and light sensitivity. EXAM: CT ANGIOGRAPHY HEAD AND NECK TECHNIQUE: Multidetector CT imaging of the head and neck was performed using the standard protocol during bolus administration of intravenous contrast. Multiplanar CT image reconstructions and MIPs were obtained to evaluate the vascular anatomy. Carotid stenosis measurements (when applicable) are obtained utilizing NASCET criteria, using the distal internal carotid diameter as the denominator. CONTRAST:  75mL ISOVUE-370 IOPAMIDOL (ISOVUE-370) INJECTION 76% COMPARISON:  None. FINDINGS: CT HEAD FINDINGS Brain: No evidence of acute infarction, hemorrhage, hydrocephalus, extra-axial collection or mass lesion/mass effect. Vascular: No hyperdense vessel or unexpected calcification. Skull: Normal. Negative for fracture or focal lesion. Sinuses: Imaged portions are clear. Orbits: No acute finding. CTA NECK FINDINGS Aortic arch: Standard branching. Imaged portion shows no evidence of aneurysm or dissection. No significant stenosis of the major arch vessel origins. Right carotid system: No evidence of  dissection, stenosis (50% or greater) or occlusion. Left carotid system: No evidence of dissection, stenosis (50% or greater) or occlusion. Vertebral arteries: Codominant. No evidence of dissection, stenosis (50% or greater) or occlusion. Skeleton: No worrisome osseous findings. Other neck: No neck masses or inflammatory process. Upper chest: Clear. Review of the MIP images confirms the above findings CTA HEAD FINDINGS Anterior circulation: No significant stenosis, proximal occlusion, aneurysm, or vascular malformation. Posterior circulation: No significant stenosis, proximal occlusion, aneurysm, or vascular malformation. Venous sinuses: As permitted by contrast timing, patent. Anatomic variants: None. Delayed phase: No abnormal intracranial enhancement. Review of the MIP images  confirms the above findings IMPRESSION: Negative exam. No acute or focal intracranial abnormality. No intracranial or extracranial stenosis or dissection. No visible saccular aneurysm. No abnormal postcontrast enhancement. Electronically Signed   By: Elsie Stain M.D.   On: 01/10/2018 13:04   Ct Angio Neck W And/or Wo Contrast  Result Date: 01/10/2018 CLINICAL DATA:  Pain behind the LEFT eye since earlier today. LEFT-sided facial numbness with headache, nausea and light sensitivity. EXAM: CT ANGIOGRAPHY HEAD AND NECK TECHNIQUE: Multidetector CT imaging of the head and neck was performed using the standard protocol during bolus administration of intravenous contrast. Multiplanar CT image reconstructions and MIPs were obtained to evaluate the vascular anatomy. Carotid stenosis measurements (when applicable) are obtained utilizing NASCET criteria, using the distal internal carotid diameter as the denominator. CONTRAST:  75mL ISOVUE-370 IOPAMIDOL (ISOVUE-370) INJECTION 76% COMPARISON:  None. FINDINGS: CT HEAD FINDINGS Brain: No evidence of acute infarction, hemorrhage, hydrocephalus, extra-axial collection or mass lesion/mass effect. Vascular: No hyperdense vessel or unexpected calcification. Skull: Normal. Negative for fracture or focal lesion. Sinuses: Imaged portions are clear. Orbits: No acute finding. CTA NECK FINDINGS Aortic arch: Standard branching. Imaged portion shows no evidence of aneurysm or dissection. No significant stenosis of the major arch vessel origins. Right carotid system: No evidence of dissection, stenosis (50% or greater) or occlusion. Left carotid system: No evidence of dissection, stenosis (50% or greater) or occlusion. Vertebral arteries: Codominant. No evidence of dissection, stenosis (50% or greater) or occlusion. Skeleton: No worrisome osseous findings. Other neck: No neck masses or inflammatory process. Upper chest: Clear. Review of the MIP images confirms the above findings CTA HEAD  FINDINGS Anterior circulation: No significant stenosis, proximal occlusion, aneurysm, or vascular malformation. Posterior circulation: No significant stenosis, proximal occlusion, aneurysm, or vascular malformation. Venous sinuses: As permitted by contrast timing, patent. Anatomic variants: None. Delayed phase: No abnormal intracranial enhancement. Review of the MIP images confirms the above findings IMPRESSION: Negative exam. No acute or focal intracranial abnormality. No intracranial or extracranial stenosis or dissection. No visible saccular aneurysm. No abnormal postcontrast enhancement. Electronically Signed   By: Elsie Stain M.D.   On: 01/10/2018 13:04    ____________________________________________   PROCEDURES  Pr  Procedures  Critical Care performed:   ____________________________________________   INITIAL IMPRESSION / ASSESSMENT AND PLAN / ED COURSE  CT and CT angiogram head and neck show no aneurysm or other problem. Patient says she had a TIA in 2017 and was in the hospital for workup.at 1:15 her numbness has resolved but she still had the history of numbness which may have been a TIA. I have discussed patient with Dr. Donata Duff come in and he did recommend MRI and admission.         ____________________________________________   FINAL CLINICAL IMPRESSION(S) / ED DIAGNOSES  Final diagnoses:  TIA (transient ischemic attack)     ED Discharge Orders  None       Note:  This document was prepared using Dragon voice recognition software and may include unintentional dictation errors.    Arnaldo Natal, MD 01/10/18 1316    Arnaldo Natal, MD 01/17/18 2351

## 2018-01-10 NOTE — ED Notes (Signed)
First Nurse Note:  Patient states she woke at 0300 this AM with pain behind left eye and facial numbness.

## 2018-01-10 NOTE — ED Notes (Signed)
ED Provider at bedside. 

## 2018-01-10 NOTE — H&P (Signed)
[facility] [place] [facility]   PATIENT NAME: Teresa Whitaker    MR#:  [id number]  DATE OF BIRTH:  02-03-1971  DATE OF ADMISSION:  01/10/2018  PRIMARY CARE PHYSICIAN: No primary care provider on file.   REQUESTING/REFERRING PHYSICIAN:   CHIEF COMPLAINT:   Chief Complaint  Patient presents with  . Numbness  . Eye Pain    HISTORY OF PRESENT ILLNESS: Brynna Dobos  is a 47 y.o. female with a known history per below presented to the emergency room with acute posterior left eye pain/headache started at 3 AM associated with left facial numbness/weakness, right arm numbness, nausea, lightheadedness, patient stated that it feels similar to previous mini stroke, ER work-up was unimpressive, CT angios head/neck unimpressive, EKG benign, patient evaluated in emergency room, no apparent distress, patient denies any neurological motor deficits, patient now being admitted for acute probable TIA versus migraine.  PAST MEDICAL HISTORY:   Past Medical History:  Diagnosis Date  . Adrenal mass (HCC)   . Asthma   . Brain aneurysm   . TIA (transient ischemic attack)     PAST SURGICAL HISTORY:  Past Surgical History:  Procedure Laterality Date  . CARDIAC CATHETERIZATION    . CESAREAN SECTION    . CHOLECYSTECTOMY      SOCIAL HISTORY:  Social History   Tobacco Use  . Smoking status: Never Smoker  . Smokeless tobacco: Never Used  Substance Use Topics  . Alcohol use: Not Currently    FAMILY HISTORY:  HTN  DRUG ALLERGIES:  Allergies  Allergen Reactions  . Amoxicillin Hives  . Bactrim [Sulfamethoxazole-Trimethoprim]     REVIEW OF SYSTEMS:   CONSTITUTIONAL: No fever, fatigue or weakness.  EYES: No blurred or double vision.  EARS, NOSE, AND THROAT: No tinnitus or ear pain.  RESPIRATORY: No cough, shortness of breath, wheezing or hemoptysis.  CARDIOVASCULAR: No chest pain, orthopnea, edema.  GASTROINTESTINAL: No nausea, vomiting, diarrhea or abdominal pain.   GENITOURINARY: No dysuria, hematuria.  ENDOCRINE: No polyuria, nocturia,  HEMATOLOGY: No anemia, easy bruising or bleeding SKIN: No rash or lesion. MUSCULOSKELETAL: No joint pain or arthritis.   NEUROLOGIC: Headache, left facial numbness/weakness, right arm numbness  PSYCHIATRY: No anxiety or depression.   MEDICATIONS AT HOME:  Prior to Admission medications   Medication Sig Start Date End Date Taking? Authorizing Provider  acetaminophen (TYLENOL) 650 MG CR tablet Take 650 mg by mouth every 8 (eight) hours as needed for pain.   Yes [provider]  albuterol (PROVENTIL HFA;VENTOLIN HFA) 108 (90 Base) MCG/ACT inhaler Inhale 2 puffs into the lungs 4 (four) times daily.   Yes [provider]  aspirin EC 81 MG tablet Take 81-162 mg by mouth daily.   Yes [provider]  aspirin-acetaminophen-caffeine (EXCEDRIN MIGRAINE) 680-815-9763 MG tablet Take 1-2 tablets by mouth every 6 (six) hours as needed for headache.   Yes [provider]  Butalbital-APAP-Caffeine (FIORICET) 50-300-40 MG CAPS Take 1 capsule by mouth every 6 (six) hours as needed (headache (max 3 per episode/10 per week)).   Yes [provider]  diclofenac (VOLTAREN) 75 MG EC tablet Take 75 mg by mouth 2 (two) times daily.   Yes [provider]  ranitidine (ZANTAC) 150 MG tablet Take 150 mg by mouth 2 (two) times daily.   Yes [provider]  traMADol (ULTRAM) 50 MG tablet Take 50 mg by mouth every 4 (four) hours as needed for moderate pain.   Yes [provider]  PHYSICAL EXAMINATION:   VITAL SIGNS: Blood pressure 112/60, pulse (!) 59, temperature 98.2 F (36.8 C), temperature source Oral, resp. rate 19, height  (1.626 m), weight 96.6 kg (213 lb), SpO2 96 %.  GENERAL:  47 y.o.-year-old patient lying in the bed with no acute distress.  EYES: Pupils equal, round, reactive to light and accommodation. No scleral icterus. Extraocular muscles intact.   HEENT: Head atraumatic, normocephalic. Oropharynx and nasopharynx clear.  NECK:  Supple, no jugular venous distention. No thyroid enlargement, no tenderness.  LUNGS: Normal breath sounds bilaterally, no wheezing, rales,rhonchi or crepitation. No use of accessory muscles of respiration.  CARDIOVASCULAR: S1, S2 normal. No murmurs, rubs, or gallops.  ABDOMEN: Soft, nontender, nondistended. Bowel sounds present. No organomegaly or mass.  EXTREMITIES: No pedal edema, cyanosis, or clubbing.  NEUROLOGIC: Cranial nerves II through XII are intact. Muscle strength 5/5 in all extremities. Sensation intact. Gait not checked.  PSYCHIATRIC: The patient is alert and oriented x 3.  SKIN: No obvious rash, lesion, or ulcer.   LABORATORY PANEL:   CBC Recent Labs  Lab 01/10/18 1101  WBC 6.9  HGB 13.5  HCT 38.5  PLT 334  MCV 84.6  MCH 29.7  MCHC 35.1  RDW 13.0   ------------------------------------------------------------------------------------------------------------------  Chemistries  Recent Labs  Lab 01/10/18 1101  NA 138  K 3.7  CL 107  CO2 27  GLUCOSE 109*  BUN 15  CREATININE 0.71  CALCIUM 9.0  AST 15  ALT 16  ALKPHOS 35*  BILITOT 0.6   ------------------------------------------------------------------------------------------------------------------ estimated creatinine clearance is 100.2 mL/min (by C-G formula based on SCr of 0.71 mg/dL). ------------------------------------------------------------------------------------------------------------------ No results for input(s): TSH, T4TOTAL, T3FREE, THYROIDAB in the last 72 hours.  Invalid input(s): FREET3   Coagulation profile No results for input(s): INR, PROTIME in the last 168 hours. ------------------------------------------------------------------------------------------------------------------- No results for input(s): DDIMER in the last 72  hours. -------------------------------------------------------------------------------------------------------------------  Cardiac Enzymes No results for input(s): CKMB, TROPONINI, MYOGLOBIN in the last 168 hours.  Invalid input(s): CK ------------------------------------------------------------------------------------------------------------------ Invalid input(s): POCBNP  ---------------------------------------------------------------------------------------------------------------  Urinalysis No results found for: COLORURINE, APPEARANCEUR, LABSPEC, PHURINE, GLUCOSEU, HGBUR, BILIRUBINUR, KETONESUR, PROTEINUR, UROBILINOGEN, NITRITE, LEUKOCYTESUR   RADIOLOGY: Ct Angio Head W Or Wo Contrast  Result Date: 01/10/2018 CLINICAL DATA:  Pain behind the LEFT eye since earlier today. LEFT-sided facial numbness with headache, nausea and light sensitivity. EXAM: CT ANGIOGRAPHY HEAD AND NECK TECHNIQUE: Multidetector CT imaging of the head and neck was performed using the standard protocol during bolus administration of intravenous contrast. Multiplanar CT image reconstructions and MIPs were obtained to evaluate the vascular anatomy. Carotid stenosis measurements (when applicable) are obtained utilizing NASCET criteria, using the distal internal carotid diameter as the denominator. CONTRAST:  75mL ISOVUE-370 IOPAMIDOL (ISOVUE-370) INJECTION 76% COMPARISON:  None. FINDINGS: CT HEAD FINDINGS Brain: No evidence of acute infarction, hemorrhage, hydrocephalus, extra-axial collection or mass lesion/mass effect. Vascular: No hyperdense vessel or unexpected calcification. Skull: Normal. Negative for fracture or focal lesion. Sinuses: Imaged portions are clear. Orbits: No acute finding. CTA NECK FINDINGS Aortic arch: Standard branching. Imaged portion shows no evidence of aneurysm or dissection. No significant stenosis of the major arch vessel origins. Right carotid system: No evidence of dissection, stenosis (50% or  greater) or occlusion. Left carotid system: No evidence of dissection, stenosis (50% or greater) or occlusion. Vertebral arteries: Codominant. No evidence of dissection, stenosis (50% or greater) or occlusion. Skeleton: No worrisome osseous findings. Other neck: No neck masses or inflammatory process. Upper chest: Clear. Review of the MIP images confirms  the above findings CTA HEAD FINDINGS Anterior circulation: No significant stenosis, proximal occlusion, aneurysm, or vascular malformation. Posterior circulation: No significant stenosis, proximal occlusion, aneurysm, or vascular malformation. Venous sinuses: As permitted by contrast timing, patent. Anatomic variants: None. Delayed phase: No abnormal intracranial enhancement. Review of the MIP images confirms the above findings IMPRESSION: Negative exam. No acute or focal intracranial abnormality. No intracranial or extracranial stenosis or dissection. No visible saccular aneurysm. No abnormal postcontrast enhancement. Electronically Signed   By: Elsie Stain M.D.   On: 01/10/2018 13:04   Ct Angio Neck W And/or Wo Contrast  Result Date: 01/10/2018 CLINICAL DATA:  Pain behind the LEFT eye since earlier today. LEFT-sided facial numbness with headache, nausea and light sensitivity. EXAM: CT ANGIOGRAPHY HEAD AND NECK TECHNIQUE: Multidetector CT imaging of the head and neck was performed using the standard protocol during bolus administration of intravenous contrast. Multiplanar CT image reconstructions and MIPs were obtained to evaluate the vascular anatomy. Carotid stenosis measurements (when applicable) are obtained utilizing NASCET criteria, using the distal internal carotid diameter as the denominator. CONTRAST:  75mL ISOVUE-370 IOPAMIDOL (ISOVUE-370) INJECTION 76% COMPARISON:  None. FINDINGS: CT HEAD FINDINGS Brain: No evidence of acute infarction, hemorrhage, hydrocephalus, extra-axial collection or mass lesion/mass effect. Vascular: No hyperdense vessel or  unexpected calcification. Skull: Normal. Negative for fracture or focal lesion. Sinuses: Imaged portions are clear. Orbits: No acute finding. CTA NECK FINDINGS Aortic arch: Standard branching. Imaged portion shows no evidence of aneurysm or dissection. No significant stenosis of the major arch vessel origins. Right carotid system: No evidence of dissection, stenosis (50% or greater) or occlusion. Left carotid system: No evidence of dissection, stenosis (50% or greater) or occlusion. Vertebral arteries: Codominant. No evidence of dissection, stenosis (50% or greater) or occlusion. Skeleton: No worrisome osseous findings. Other neck: No neck masses or inflammatory process. Upper chest: Clear. Review of the MIP images confirms the above findings CTA HEAD FINDINGS Anterior circulation: No significant stenosis, proximal occlusion, aneurysm, or vascular malformation. Posterior circulation: No significant stenosis, proximal occlusion, aneurysm, or vascular malformation. Venous sinuses: As permitted by contrast timing, patent. Anatomic variants: None. Delayed phase: No abnormal intracranial enhancement. Review of the MIP images confirms the above findings IMPRESSION: Negative exam. No acute or focal intracranial abnormality. No intracranial or extracranial stenosis or dissection. No visible saccular aneurysm. No abnormal postcontrast enhancement. Electronically Signed   By: Elsie Stain M.D.   On: 01/10/2018 13:04    EKG: Orders placed or performed during the hospital encounter of 01/10/18  . EKG 12-Lead  . 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 TIA Presenting with recurrent acute left-sided headache, left facial numbness/weakness, right arm numbness, similar to previous TIA, noted history of migraines and history of cerebral aneurysm Referred to the observation unit on our TIA versus CVA protocol neurology consultation for expert opinion,, check MRI of the brain/echocardiogram for  further evaluation, continue aspirin, start statin therapy-check lipids in the morning  *History of migraines Stable-states that this headache is typical of her usual migraine headaches continue current regiment  *Chronic asthma without exacerbation stable Stable BTs prn  *Chronic obesity Stable Modification recommended  *History of cerebral aneurysm Appears stable  conservative medical monitoring, neurology to see  Disposition home tomorrow barring any complications   All the records are reviewed and case discussed with ED provider. Management plans discussed with the patient, family and they are in agreement.  CODE STATUS:full    TOTAL TIME TAKING CARE OF THIS PATIENT: 45  minutes.    Evelena Asa Jaksen Fiorella M.D on 01/10/2018   Between 7am to 6pm - Pager - [phone]  After 6pm go to [email or web address] - Social research officer, government  [facility]  Office  [phone]  CC: Primary care physician; No primary care provider on file.   Note: This dictation was prepared with Dragon dictation along with smaller phrase technology. Any transcriptional errors that result from this process are unintentional.

## 2018-01-10 NOTE — Progress Notes (Signed)
   01/10/18 1900  Clinical Encounter Type  Visited With Patient and family together  Visit Type Initial  Referral From Nurse  Consult/Referral To Chaplain  Spiritual Encounters  Spiritual Needs Brochure   CH received a OR to educate PT on AD. PT was given AD paperwork and asked to have CH PG if PT had any question.

## 2018-01-11 ENCOUNTER — Observation Stay: Payer: Medicaid Other

## 2018-01-11 ENCOUNTER — Observation Stay (HOSPITAL_BASED_OUTPATIENT_CLINIC_OR_DEPARTMENT_OTHER)
Admit: 2018-01-11 | Discharge: 2018-01-11 | Disposition: A | Discharge status: Home / Self Care | Payer: Medicaid Other | Attending: Family Medicine | Admitting: Family Medicine

## 2018-01-11 DIAGNOSIS — I10 Essential (primary) hypertension: Secondary | ICD-10-CM

## 2018-01-11 LAB — LIPID PANEL
Cholesterol: 133 mg/dL (ref 0–200)
HDL: 25 mg/dL — ABNORMAL LOW (ref >40)
LDL CALC: 76 mg/dL (ref 0–99)
TRIGLYCERIDES: 158 mg/dL — AB (ref <150)
Total CHOL/HDL Ratio: 5.3 RATIO
VLDL: 32 mg/dL (ref 0–40)

## 2018-01-11 LAB — ECHOCARDIOGRAM COMPLETE
HEIGHTINCHES: 64 in
WEIGHTICAEL: 3408 [oz_av]

## 2018-01-11 LAB — HEMOGLOBIN A1C
HEMOGLOBIN A1C: 5.4 % (ref 4.8–5.6)
Mean Plasma Glucose: 108.28 mg/dL

## 2018-01-11 MED ORDER — BUTALBITAL-APAP-CAFFEINE 50-300-40 MG PO CAPS: 1.0000 | ORAL_CAPSULE | Freq: Four times a day (QID) | ORAL | 0 refills | Status: DC | PRN

## 2018-01-11 MED ORDER — AMLODIPINE BESYLATE 10 MG PO TABS: 10.0000 mg | ORAL_TABLET | Freq: Every day | ORAL | 11 refills | Status: DC

## 2018-01-11 MED ORDER — SODIUM CHLORIDE 0.9% FLUSH
3.0000 mL | Freq: Two times a day (BID) | INTRAVENOUS | Status: DC
  Administered 2018-01-11: 10:00:00 3 mL via INTRAVENOUS

## 2018-01-11 MED ORDER — DOCUSATE SODIUM 100 MG PO CAPS: 100.0000 mg | ORAL_CAPSULE | Freq: Two times a day (BID) | ORAL | 0 refills | Status: DC | PRN

## 2018-01-11 MED ORDER — DOCUSATE SODIUM 100 MG PO CAPS
100.0000 mg | ORAL_CAPSULE | Freq: Two times a day (BID) | ORAL | Status: DC | PRN
  Administered 2018-01-11: 100 mg via ORAL
  Filled 2018-01-11: qty 1

## 2018-01-11 NOTE — Progress Notes (Signed)
*  PRELIMINARY RESULTS* Echocardiogram 2D Echocardiogram has been performed.  Teresa Whitaker [name] 01/11/2018, 11:45 AM

## 2018-01-11 NOTE — Progress Notes (Signed)
NIH-0 with neurochecks/vs's WNL's. Reports 1 pain in head which she relates to grinding her teeth during the night. Reports has noted has HA when her BP is elevated. ECHO done and awaiting report with discharge tentative is no abnormality. Eating well/ Up ad lib in room.

## 2018-01-11 NOTE — Progress Notes (Signed)
Pt off the unit to MRI. Henriette Combs RN

## 2018-01-11 NOTE — Progress Notes (Signed)
SLP Cancellation Note  Patient Details Name: Teresa Whitaker MRN: [id number] DOB: [birth year]   Cancelled treatment:       Reason Eval/Treat Not Completed: SLP screened, no needs identified, will sign off(chart reviewed; consulted NSG then met w/ pt). Pt denied any difficulty swallowing and is currently on a regular diet; tolerates swallowing pills w/ water per NSG. Just finishing her breakfast meal. Pt conversed at conversational level w/out deficits noted; pt denied any speech-language deficits. Texting on the phone w/ boyfriend she said. No further skilled ST services indicated as pt appears at her baseline. Pt agreed. NSG to reconsult if any change in status.     Orinda Kenner, MS, CCC-SLP Rhyan Wolters 01/11/2018, 9:00 AM

## 2018-01-11 NOTE — Progress Notes (Signed)
Pt transported to private vehicle accompanied by significant other.Discharged home to self care.

## 2018-01-11 NOTE — Progress Notes (Signed)
PT Cancellation Note  Patient Details Name: Teresa Whitaker MRN: [id number] DOB: [birth year]   Cancelled Treatment:    Reason Eval/Treat Not Completed: Patient at procedure or test/unavailable. Pt not in room, gone for testing when PT attempted eval. Will re-attempt eval at later date or time, when pt is available.   Zerita Boers, PT,DPT 01/11/18 11:18 AM Phone: [phone] Fax: [phone]     Miller,Jennifer L 01/11/2018, 11:18 AM

## 2018-01-11 NOTE — Progress Notes (Signed)
Admitted for evaluation of TIA: Patient had facial numbness, denies any facial numbness, weakness.  No neurological symptoms, able for discharge if the echo is within normal limits.  Patient MRI negative for acute stroke.  Patient told me that her blood pressure is about to 170/80 when she gets headache.  Wondering if he needs to be on BP medicines, I will add Norvasc 5 mg daily, she also wanted prescription for Fioricet.  She moved from [place] 2 months back and also asked her to have a PCP and establish with PCP for regular management of her migraine, BP.

## 2018-01-11 NOTE — Discharge Instructions (Signed)
Ischemic Stroke °An ischemic stroke is the sudden death of brain tissue. Blood carries oxygen to all areas of the body. This type of stroke happens when your blood does not flow to your brain like normal. Your brain cannot get the oxygen it needs. This is an emergency. It must be treated right away. °Symptoms of a stroke usually happen all of a sudden. You may notice them when you wake up. They can include: °· Weakness or loss of feeling in your face, arm, or leg. This often happens on one side of the body. °· Trouble walking. °· Trouble moving your arms or legs. °· Loss of balance or coordination. °· Feeling confused. °· Trouble talking or understanding what people are saying. °· Slurred speech. °· Trouble seeing. °· Seeing two of one object (double vision). °· Feeling dizzy. °· Feeling sick to your stomach (nauseous) and throwing up (vomiting). °· A very bad headache for no reason. ° °Get help as soon as any of these problems start. This is important. Some treatments work better if they are given right away. These include: °· Aspirin. °· Medicines to control blood pressure. °· A shot (injection) of medicine to break up the blood clot. °· Treatments given in the blood vessel (artery) to take out the clot or break it up. ° °Other treatments may include: °· Oxygen. °· Fluids given through an IV tube. °· Medicines to thin out your blood. °· Procedures to help your blood flow better. ° °What increases the risk? °Certain things may make you more likely to have a stroke. Some of these are things that you can change, such as: °· Being very overweight (obesity). °· Smoking. °· Taking birth control pills. °· Not being active. °· Drinking too much alcohol. °· Using drugs. ° °Other risk factors include: °· High blood pressure. °· High cholesterol. °· Diabetes. °· Heart disease. °· Being African American, Native American, Hispanic, or [place] Native. °· Being over age 60. °· Family history of stroke. °· Having had blood clots,  stroke, or warning stroke (transient ischemic attack, TIA) in the past. °· Sickle cell disease. °· Being a woman with a history of high blood pressure in pregnancy (preeclampsia). °· Migraine headache. °· Sleep apnea. °· Having an irregular heartbeat (atrial fibrillation). °· Long-term (chronic) diseases that cause soreness and swelling (inflammation). °· Disorders that affect how your blood clots. ° °Follow these instructions at home: °Medicines °· Take over-the-counter and prescription medicines only as told by your doctor. °· If you were told to take aspirin or another medicine to thin your blood, take it exactly as told by your doctor. °? Taking too much of the medicine can cause bleeding. °? If you do not take enough, it may not work as well. °· Know the side effects of your medicines. If you are taking a blood thinner, make sure you: °? Hold pressure over any cuts for longer than usual. °? Tell your dentist and other doctors that you take this medicine. °? Avoid activities that may cause damage or injury to your body. °Eating and drinking °· Follow instructions from your doctor about what you cannot eat or drink. °· Eat healthy foods. °· If you have trouble with swallowing, do these things to avoid choking: °? Take small bites when eating. °? Eat foods that are soft or pureed. °Safety °· Follow instructions from your health care team about physical activity. °· Use a walker or cane as told by your doctor. °· Keep your home safe so   you do not fall. This may include: °? Having experts look at your home to make sure it is safe. °? Putting grab bars in the bedroom and bathroom. °? Using raised toilets. °? Putting a seat in the shower. °General instructions °· Do not use any tobacco products. °? Examples of these are cigarettes, chewing tobacco, and e-cigarettes. °? If you need help quitting, ask your doctor. °· Limit how much alcohol you drink. This means no more than 1 drink a day for nonpregnant women and 2  drinks a day for men. One drink equals 12 oz of beer, 5 oz of wine, or 1½ oz of hard liquor. °· If you need help to stop using drugs or alcohol, ask your doctor to refer you to a program or specialist. °· Stay active. Exercise as told by your doctor. °· Keep all follow-up visits as told by your doctor. This is important. °Get help right away if: °· You suddenly: °? Have weakness or loss of feeling in your face, arm, or leg. °? Feel confused. °? Have trouble talking or understanding what people are saying. °? Have trouble seeing. °? Have trouble walking. °? Have trouble moving your arms or legs. °? Feel dizzy. °? Lose your balance or coordination. °? Have a very bad headache and you do not know why. °· You pass out (lose consciousness) or almost pass out. °· You have jerky movements that you cannot control (seizure). °These symptoms may be an emergency. Do not wait to see if the symptoms will go away. Get medical help right away. Call your local emergency services (911 in the [place] Do not drive yourself to the hospital. °This information is not intended to replace advice given to you by your health care provider. Make sure you discuss any questions you have with your health care provider. °Document Released: 07/26/2011 Document Revised: 01/17/2016 Document Reviewed: 11/02/2015 °Elsevier Interactive Patient Education © 2018 [employer] ° °

## 2018-01-11 NOTE — Progress Notes (Signed)
Pt returned from MRI.  Telemetry reconnected.Henriette Combs RN

## 2018-01-11 NOTE — Progress Notes (Signed)
OT Cancellation Note  Patient Details Name: Teresa Whitaker MRN: [id number] DOB: October 01, 1970   Cancelled Treatment:    Reason Eval/Treat Not Completed: OT screened, no needs identified, will sign off  Olegario Messier, MS, OTR/L 01/11/2018, 12:04 PM

## 2018-01-11 NOTE — Progress Notes (Signed)
ECHO report called to Dr. Luberta Mutter with discharge ordered. Oral and written AVS with 2 prescriptions given to pt with stated understanding.

## 2018-01-11 NOTE — Progress Notes (Signed)
PT Cancellation Note  Patient Details Name: Teresa Whitaker MRN: [id number] DOB: 1970-12-09   Cancelled Treatment:    Reason Eval/Treat Not Completed: PT screened, no needs identified, will sign off; Pt reports no concerns at this time and feels back to baseline.  Pt reports has been walking independently in her room with no concerns and has been performing ADLs independently.  Pt reports does not require skilled PT services at this time.   Will complete PT orders but will reassess pt pending a change in status upon receipt of new PT orders.    Ovidio Hanger PT, DPT 01/11/18, 2:12 PM

## 2018-01-12 LAB — HIV ANTIBODY (ROUTINE TESTING W REFLEX): HIV SCREEN 4TH GENERATION: NONREACTIVE

## 2018-01-13 NOTE — Discharge Summary (Signed)
Teresa Whitaker, is a 47 y.o. female  DOB Jan 05, 1971  MRN [id number].  Admission date:  01/10/2018  Admitting Physician  Bertrum Sol, MD  Discharge Date:  01/13/2018   Primary MD  No primary care provider on file.  Recommendations for primary care physician for things to follow:   Follow-up with PCP in 1 week   Admission Diagnosis  TIA (transient ischemic attack) [G45.9]   Discharge Diagnosis  TIA (transient ischemic attack) [G45.9]    Active Problems:   TIA (transient ischemic attack)      Past Medical History:  Diagnosis Date  . Adrenal mass (HCC)   . Asthma   . Brain aneurysm   . TIA (transient ischemic attack)     Past Surgical History:  Procedure Laterality Date  . CARDIAC CATHETERIZATION    . CESAREAN SECTION    . CHOLECYSTECTOMY         History of present illness and  Hospital Course:     Kindly see H&P for history of present illness and admission details, please review complete Labs, Consult reports and Test reports for all details in brief  HPI  from the history and physical done on the day of admission 48 year old female patient with no past medical history came in because of left eye pain, headache, left facial numbness, right arm numbness, nausea.  Admitted to stroke unit for evaluation of TIA/migraine.   Hospital Course  #1 headache, left facial numbness.,  Evaluated for TIA including CT of the head, MRI of the brain, echocardiogram,   MRI of the brain did not show acute stroke.  Cardiogram showed EF 55 to 60%.  With normal LV function. LDL is 76. #2 migraine headaches, patient takes Excedrin Migraine, Fioricet as needed.  Patient is moved from [place] 2 months ago to this area and is trying to set up a PCP. Requested Fioricet prescription. 3.  Essential hypertension, new diagnosis,  told her that elevated BP may be causing her headache, given prescription for Norvasc 10 mg daily, advised the patient that she needs to set up a PCP for her chronic problems.  Discharge Condition: Stable   Follow UP      Discharge Instructions  and  Discharge Medications      Allergies as of 01/11/2018      Reactions   Amoxicillin Hives   Bactrim [sulfamethoxazole-trimethoprim]       Medication List    STOP taking these medications   aspirin-acetaminophen-caffeine 250-250-65 MG tablet Commonly known as:  EXCEDRIN MIGRAINE     TAKE these medications   acetaminophen 650 MG CR tablet Commonly known as:  TYLENOL Take 650 mg by mouth every 8 (eight) hours as needed for pain.   albuterol 108 (90 Base) MCG/ACT inhaler Commonly known as:  PROVENTIL HFA;VENTOLIN HFA Inhale 2 puffs into the lungs 4 (four) times daily.   amLODipine 10 MG tablet Commonly known as:  NORVASC Take 1 tablet (10 mg total) by mouth daily.   aspirin EC 81 MG tablet Take 81-162 mg by mouth daily.   Butalbital-APAP-Caffeine 50-300-40 MG Caps Commonly known as:  FIORICET Take 1 capsule by mouth every 6 (six) hours as needed (headache (max 3 per episode/10 per week)).   diclofenac 75 MG EC tablet Commonly known as:  VOLTAREN Take 75 mg by mouth 2 (two) times daily.   docusate sodium 100 MG capsule Commonly known as:  COLACE Take 1 capsule (100 mg total) by mouth 2 (two) times daily as  needed for mild constipation.   ranitidine 150 MG tablet Commonly known as:  ZANTAC Take 150 mg by mouth 2 (two) times daily.   traMADol 50 MG tablet Commonly known as:  ULTRAM Take 50 mg by mouth every 4 (four) hours as needed for moderate pain.         Diet and Activity recommendation: See Discharge Instructions above   Consults obtained - none   Major procedures and Radiology Reports - PLEASE review detailed and final reports for all details, in brief -      Ct Angio Head W Or Wo  Contrast  Result Date: 01/10/2018 CLINICAL DATA:  Pain behind the LEFT eye since earlier today. LEFT-sided facial numbness with headache, nausea and light sensitivity. EXAM: CT ANGIOGRAPHY HEAD AND NECK TECHNIQUE: Multidetector CT imaging of the head and neck was performed using the standard protocol during bolus administration of intravenous contrast. Multiplanar CT image reconstructions and MIPs were obtained to evaluate the vascular anatomy. Carotid stenosis measurements (when applicable) are obtained utilizing NASCET criteria, using the distal internal carotid diameter as the denominator. CONTRAST:  75mL ISOVUE-370 IOPAMIDOL (ISOVUE-370) INJECTION 76% COMPARISON:  None. FINDINGS: CT HEAD FINDINGS Brain: No evidence of acute infarction, hemorrhage, hydrocephalus, extra-axial collection or mass lesion/mass effect. Vascular: No hyperdense vessel or unexpected calcification. Skull: Normal. Negative for fracture or focal lesion. Sinuses: Imaged portions are clear. Orbits: No acute finding. CTA NECK FINDINGS Aortic arch: Standard branching. Imaged portion shows no evidence of aneurysm or dissection. No significant stenosis of the major arch vessel origins. Right carotid system: No evidence of dissection, stenosis (50% or greater) or occlusion. Left carotid system: No evidence of dissection, stenosis (50% or greater) or occlusion. Vertebral arteries: Codominant. No evidence of dissection, stenosis (50% or greater) or occlusion. Skeleton: No worrisome osseous findings. Other neck: No neck masses or inflammatory process. Upper chest: Clear. Review of the MIP images confirms the above findings CTA HEAD FINDINGS Anterior circulation: No significant stenosis, proximal occlusion, aneurysm, or vascular malformation. Posterior circulation: No significant stenosis, proximal occlusion, aneurysm, or vascular malformation. Venous sinuses: As permitted by contrast timing, patent. Anatomic variants: None. Delayed phase: No  abnormal intracranial enhancement. Review of the MIP images confirms the above findings IMPRESSION: Negative exam. No acute or focal intracranial abnormality. No intracranial or extracranial stenosis or dissection. No visible saccular aneurysm. No abnormal postcontrast enhancement. Electronically Signed   By: Elsie Stain M.D.   On: 01/10/2018 13:04   Dg Chest 2 View  Result Date: 01/10/2018 CLINICAL DATA:  47 y/o  F; probable TIA. EXAM: CHEST - 2 VIEW COMPARISON:  None. FINDINGS: The heart size and mediastinal contours are within normal limits. Both lungs are clear. The visualized skeletal structures are unremarkable. Right upper quadrant surgical clips, presumably cholecystectomy. IMPRESSION: No acute pulmonary process identified. Electronically Signed   By: Mitzi Hansen M.D.   On: 01/10/2018 19:13   Ct Angio Neck W And/or Wo Contrast  Result Date: 01/10/2018 CLINICAL DATA:  Pain behind the LEFT eye since earlier today. LEFT-sided facial numbness with headache, nausea and light sensitivity. EXAM: CT ANGIOGRAPHY HEAD AND NECK TECHNIQUE: Multidetector CT imaging of the head and neck was performed using the standard protocol during bolus administration of intravenous contrast. Multiplanar CT image reconstructions and MIPs were obtained to evaluate the vascular anatomy. Carotid stenosis measurements (when applicable) are obtained utilizing NASCET criteria, using the distal internal carotid diameter as the denominator. CONTRAST:  75mL ISOVUE-370 IOPAMIDOL (ISOVUE-370) INJECTION 76% COMPARISON:  None. FINDINGS: CT HEAD  FINDINGS Brain: No evidence of acute infarction, hemorrhage, hydrocephalus, extra-axial collection or mass lesion/mass effect. Vascular: No hyperdense vessel or unexpected calcification. Skull: Normal. Negative for fracture or focal lesion. Sinuses: Imaged portions are clear. Orbits: No acute finding. CTA NECK FINDINGS Aortic arch: Standard branching. Imaged portion shows no evidence  of aneurysm or dissection. No significant stenosis of the major arch vessel origins. Right carotid system: No evidence of dissection, stenosis (50% or greater) or occlusion. Left carotid system: No evidence of dissection, stenosis (50% or greater) or occlusion. Vertebral arteries: Codominant. No evidence of dissection, stenosis (50% or greater) or occlusion. Skeleton: No worrisome osseous findings. Other neck: No neck masses or inflammatory process. Upper chest: Clear. Review of the MIP images confirms the above findings CTA HEAD FINDINGS Anterior circulation: No significant stenosis, proximal occlusion, aneurysm, or vascular malformation. Posterior circulation: No significant stenosis, proximal occlusion, aneurysm, or vascular malformation. Venous sinuses: As permitted by contrast timing, patent. Anatomic variants: None. Delayed phase: No abnormal intracranial enhancement. Review of the MIP images confirms the above findings IMPRESSION: Negative exam. No acute or focal intracranial abnormality. No intracranial or extracranial stenosis or dissection. No visible saccular aneurysm. No abnormal postcontrast enhancement. Electronically Signed   By: Elsie Stain M.D.   On: 01/10/2018 13:04   Mr Brain Wo Contrast  Result Date: 01/11/2018 CLINICAL DATA:  Thunderclap headache EXAM: MRI HEAD WITHOUT CONTRAST TECHNIQUE: Multiplanar, multiecho pulse sequences of the brain and surrounding structures were obtained without intravenous contrast. COMPARISON:  Head CT and CTA 01/10/2018 FINDINGS: BRAIN: The midline structures are normal. There is no acute infarct or acute hemorrhage. There is no mass lesion or other mass effect. There is no hydrocephalus, dural abnormality or extra-axial collection. The white matter signal is normal for the patient's age. No age-advanced or lobar predominant atrophy. No chronic microhemorrhage or superficial siderosis. VASCULAR: Major intracranial arterial and venous sinus flow voids are  preserved. SKULL AND UPPER CERVICAL SPINE: The visualized skull base, calvarium, upper cervical spine and extracranial soft tissues are normal. SINUSES/ORBITS: No fluid levels or advanced mucosal thickening. No mastoid or middle ear effusion. The orbits are normal. IMPRESSION: Normal brain MRI. Electronically Signed   By: Deatra Robinson M.D.   On: 01/11/2018 01:11    Micro Results    No results found for this or any previous visit (from the past 240 hour(s)).     Today   Subjective:   Nadira Single today has no headache,no chest abdominal pain,no new weakness tingling or numbness, feels much better wants to go home today.  Objective:   Blood pressure 113/78, pulse 70, temperature (!) 97.4 F (36.3 C), temperature source Oral, resp. rate 20, height  (1.626 m), weight 96.6 kg (213 lb), SpO2 98 %.  No intake or output data in the 24 hours ending 01/13/18 1057  Exam Awake Alert, Oriented x 3, No new F.N deficits, Normal affect [place].AT,PERRAL Supple Neck,No JVD, No cervical lymphadenopathy appriciated.  Symmetrical Chest wall movement, Good air movement bilaterally, CTAB RRR,No Gallops,Rubs or new Murmurs, No Parasternal Heave +ve B.Sounds, Abd Soft, Non tender, No organomegaly appriciated, No rebound -guarding or rigidity. No Cyanosis, Clubbing or edema, No new Rash or bruise  Data Review   CBC w Diff:  Lab Results  Component Value Date   WBC 6.9 01/10/2018   HGB 13.5 01/10/2018   HCT 38.5 01/10/2018   PLT 334 01/10/2018    CMP:  Lab Results  Component Value Date   NA 138 01/10/2018  K 3.7 01/10/2018   CL 107 01/10/2018   CO2 27 01/10/2018   BUN 15 01/10/2018   CREATININE 0.71 01/10/2018   PROT 5.8 (L) 01/10/2018   ALBUMIN 3.5 01/10/2018   BILITOT 0.6 01/10/2018   ALKPHOS 35 (L) 01/10/2018   AST 15 01/10/2018   ALT 16 01/10/2018  .   Total Time in preparing paper work, data evaluation and todays exam - 35 minutes  Katha Hamming M.D on 01/11/2018 at  10:57 AM    Note: This dictation was prepared with Dragon dictation along with smaller phrase technology. Any transcriptional errors that result from this process are unintentional.

## 2018-01-30 ENCOUNTER — Other Ambulatory Visit: Payer: Self-pay

## 2018-01-30 ENCOUNTER — Encounter: Payer: Self-pay | Admitting: Emergency Medicine

## 2018-01-30 ENCOUNTER — Emergency Department: Payer: Self-pay

## 2018-01-30 ENCOUNTER — Emergency Department
Admission: EM | Admit: 2018-01-30 | Discharge: 2018-01-30 | Disposition: A | Discharge status: Home / Self Care | Payer: Self-pay | Attending: Emergency Medicine | Admitting: Emergency Medicine

## 2018-01-30 DIAGNOSIS — Z7982 Long term (current) use of aspirin: Secondary | ICD-10-CM | POA: Insufficient documentation

## 2018-01-30 DIAGNOSIS — Z79899 Other long term (current) drug therapy: Secondary | ICD-10-CM | POA: Insufficient documentation

## 2018-01-30 DIAGNOSIS — R079 Chest pain, unspecified: Secondary | ICD-10-CM | POA: Insufficient documentation

## 2018-01-30 DIAGNOSIS — J45909 Unspecified asthma, uncomplicated: Secondary | ICD-10-CM | POA: Insufficient documentation

## 2018-01-30 DIAGNOSIS — K219 Gastro-esophageal reflux disease without esophagitis: Secondary | ICD-10-CM | POA: Insufficient documentation

## 2018-01-30 DIAGNOSIS — Z8673 Personal history of transient ischemic attack (TIA), and cerebral infarction without residual deficits: Secondary | ICD-10-CM | POA: Insufficient documentation

## 2018-01-30 DIAGNOSIS — R1013 Epigastric pain: Secondary | ICD-10-CM | POA: Insufficient documentation

## 2018-01-30 LAB — CBC WITH DIFFERENTIAL/PLATELET
BASOS PCT: 1 %
Basophils Absolute: 0 10*3/uL (ref 0–0.1)
EOS ABS: 0.2 10*3/uL (ref 0–0.7)
EOS PCT: 2 %
HCT: 38.6 % (ref 35.0–47.0)
Hemoglobin: 13.3 g/dL (ref 12.0–16.0)
Lymphocytes Relative: 30 %
Lymphs Abs: 2.3 10*3/uL (ref 1.0–3.6)
MCH: 29.4 pg (ref 26.0–34.0)
MCHC: 34.6 g/dL (ref 32.0–36.0)
MCV: 85.1 fL (ref 80.0–100.0)
MONO ABS: 0.5 10*3/uL (ref 0.2–0.9)
MONOS PCT: 7 %
Neutro Abs: 4.4 10*3/uL (ref 1.4–6.5)
Neutrophils Relative %: 60 %
PLATELETS: 364 10*3/uL (ref 150–440)
RBC: 4.53 MIL/uL (ref 3.80–5.20)
RDW: 12.8 % (ref 11.5–14.5)
WBC: 7.5 10*3/uL (ref 3.6–11.0)

## 2018-01-30 LAB — COMPREHENSIVE METABOLIC PANEL
ALBUMIN: 3.8 g/dL (ref 3.5–5.0)
ALT: 20 U/L (ref 14–54)
ANION GAP: 7 (ref 5–15)
AST: 16 U/L (ref 15–41)
Alkaline Phosphatase: 45 U/L (ref 38–126)
BUN: 13 mg/dL (ref 6–20)
CHLORIDE: 106 mmol/L (ref 101–111)
CO2: 26 mmol/L (ref 22–32)
Calcium: 8.7 mg/dL — ABNORMAL LOW (ref 8.9–10.3)
Creatinine, Ser: 0.83 mg/dL (ref 0.44–1.00)
GFR calc Af Amer: >60 mL/min (ref >60)
GFR calc non Af Amer: >60 mL/min (ref >60)
GLUCOSE: 107 mg/dL — AB (ref 65–99)
POTASSIUM: 3.5 mmol/L (ref 3.5–5.1)
SODIUM: 139 mmol/L (ref 135–145)
Total Bilirubin: 0.6 mg/dL (ref 0.3–1.2)
Total Protein: 6.5 g/dL (ref 6.5–8.1)

## 2018-01-30 LAB — POCT PREGNANCY, URINE: PREG TEST UR: NEGATIVE

## 2018-01-30 LAB — URINALYSIS, COMPLETE (UACMP) WITH MICROSCOPIC
BACTERIA UA: NONE SEEN
BILIRUBIN URINE: NEGATIVE
GLUCOSE, UA: NEGATIVE mg/dL
Ketones, ur: NEGATIVE mg/dL
LEUKOCYTES UA: NEGATIVE
NITRITE: NEGATIVE
PROTEIN: NEGATIVE mg/dL
Specific Gravity, Urine: 1.025 (ref 1.005–1.030)
pH: 6 (ref 5.0–8.0)

## 2018-01-30 LAB — LIPASE, BLOOD: Lipase: 31 U/L (ref 11–51)

## 2018-01-30 LAB — TROPONIN I: Troponin I: <0.03 ng/mL (ref <0.03)

## 2018-01-30 MED ORDER — SUCRALFATE 1 G PO TABS: 1.0000 g | ORAL_TABLET | Freq: Four times a day (QID) | ORAL | 1 refills | Status: DC | PRN

## 2018-01-30 MED ORDER — OMEPRAZOLE 20 MG PO CPDR: 20.0000 mg | DELAYED_RELEASE_CAPSULE | Freq: Two times a day (BID) | ORAL | 1 refills | Status: DC

## 2018-01-30 MED ORDER — ONDANSETRON 4 MG PO TBDP: ORAL_TABLET | ORAL | 0 refills | Status: DC

## 2018-01-30 MED ORDER — GI COCKTAIL ~~LOC~~
30.0000 mL | Freq: Once | ORAL | Status: AC
  Administered 2018-01-30: 30 mL via ORAL
  Filled 2018-01-30: qty 30

## 2018-01-30 NOTE — ED Notes (Signed)
Pt in reporting severe heartburn for the past two weeks. Now reporting a knot below her breastbone in the LUQ area. She reports 6/10 midsternal burning pain starting in her mouth, proceeding down her throat and stops above her stomach She reports the GI cocktail given in triage relieved her symptoms.

## 2018-01-30 NOTE — ED Provider Notes (Signed)
[facility] Emergency Department Provider Note  ____________________________________________   First MD Initiated Contact with Patient 01/30/18 [phone]     (approximate)  I have reviewed the triage vital signs and the nursing notes.   HISTORY  Chief Complaint Abdominal Pain and Chest Pain    HPI Teresa Whitaker is a 47 y.o. female with medical history as listed below who presents for evaluation of several weeks of persistent severe upper abdominal burning pain particularly after she eats.  She says she has had trouble with acid reflux for years but it has been worse recently in spite of using ranitidine.  Nothing in particular makes it better in any kind of food makes it worse.  She describes the pain as burning and severe and radiating from the middle of her upper abdomen up into her throat.  She has felt bloated and nauseated at times.  She has had no vomiting.  Occasional loose stools.  Denies fever/chills, chest pain, shortness of breath, and dysuria.  She does not have a primary care provider or a GI doctor.  She has no lower abdominal tenderness.  She has had no blood in her stools.  Past Medical History:  Diagnosis Date  . Adrenal mass (HCC)   . Asthma   . Brain aneurysm   . TIA (transient ischemic attack)     Patient Active Problem List   Diagnosis Date Noted  . TIA (transient ischemic attack) 01/10/2018    Past Surgical History:  Procedure Laterality Date  . CARDIAC CATHETERIZATION    . CESAREAN SECTION    . CHOLECYSTECTOMY      Prior to Admission medications   Medication Sig Start Date End Date Taking? Authorizing Provider  acetaminophen (TYLENOL) 650 MG CR tablet Take 650 mg by mouth every 8 (eight) hours as needed for pain.    [provider]  albuterol (PROVENTIL HFA;VENTOLIN HFA) 108 (90 Base) MCG/ACT inhaler Inhale 2 puffs into the lungs 4 (four) times daily.    [provider]  amLODipine (NORVASC) 10 MG tablet Take 1  tablet (10 mg total) by mouth daily. 01/11/18 01/11/19  Katha Hamming, MD  aspirin EC 81 MG tablet Take 81-162 mg by mouth daily.    [provider]  Butalbital-APAP-Caffeine (FIORICET) 50-300-40 MG CAPS Take 1 capsule by mouth every 6 (six) hours as needed (headache (max 3 per episode/10 per week)). 01/11/18   Katha Hamming, MD  diclofenac (VOLTAREN) 75 MG EC tablet Take 75 mg by mouth 2 (two) times daily.    [provider]  docusate sodium (COLACE) 100 MG capsule Take 1 capsule (100 mg total) by mouth 2 (two) times daily as needed for mild constipation. 01/11/18   Salary, Evelena Asa, MD  omeprazole (PRILOSEC) 20 MG capsule Take 1 capsule (20 mg total) by mouth 2 (two) times daily. 01/30/18 01/30/19  Loleta Rose, MD  ondansetron (ZOFRAN ODT) 4 MG disintegrating tablet Allow 1-2 tablets to dissolve in your mouth every 8 hours as needed for nausea/vomiting 01/30/18   Loleta Rose, MD  ranitidine (ZANTAC) 150 MG tablet Take 150 mg by mouth 2 (two) times daily.    [provider]  sucralfate (CARAFATE) 1 g tablet Take 1 tablet (1 g total) by mouth 4 (four) times daily as needed (for abdominal discomfort, nausea, and/or vomiting). 01/30/18   Loleta Rose, MD  traMADol (ULTRAM) 50 MG tablet Take 50 mg by mouth every 4 (four) hours as needed for moderate pain.    [provider]    Allergies Amoxicillin and Bactrim [sulfamethoxazole-trimethoprim]  No family history on file.  Social History Social History   Tobacco Use  . Smoking status: Never Smoker  . Smokeless tobacco: Never Used  Substance Use Topics  . Alcohol use: Not Currently  . Drug use: Not Currently    Review of Systems Constitutional: No fever/chills Eyes: No visual changes. ENT: No sore throat. Cardiovascular: Denies chest pain. Respiratory: Denies shortness of breath. Gastrointestinal: Upper abdominal pain as described above Genitourinary: Negative for dysuria. Musculoskeletal:  Negative for neck pain.  Negative for back pain. Integumentary: Negative for rash. Neurological: Negative for headaches, focal weakness or numbness.   ____________________________________________   PHYSICAL EXAM:  VITAL SIGNS: ED Triage Vitals [01/30/18 0021]  Enc Vitals Group     BP 126/67     Pulse Rate 68     Resp 18     Temp 98.2 F (36.8 C)     Temp Source Oral     SpO2 97 %     Weight 97.1 kg (214 lb)     Height 1.626 m (5\' 4" )     Head Circumference      Peak Flow      Pain Score 7     Pain Loc      Pain Edu?      Excl. in GC?     Constitutional: Alert and oriented. Well appearing and in no acute distress. Eyes: Conjunctivae are normal.  Head: Atraumatic. Nose: No congestion/rhinnorhea. Mouth/Throat: Mucous membranes are moist. Neck: No stridor.  No meningeal signs.   Cardiovascular: Normal rate, regular rhythm. Good peripheral circulation. Grossly normal heart sounds. Respiratory: Normal respiratory effort.  No retractions. Lungs CTAB. Gastrointestinal: Soft and nondistended with mild tenderness to palpation of the epigastric region.  Negative Murphy sign, no lower abdominal tenderness, no rebound and no guarding Musculoskeletal: No lower extremity tenderness nor edema. No gross deformities of extremities. Neurologic:  Normal speech and language. No gross focal neurologic deficits are appreciated.  Skin:  Skin is warm, dry and intact. No rash noted. Psychiatric: Mood and affect are normal. Speech and behavior are normal.  ____________________________________________   LABS (all labs ordered are listed, but only abnormal results are displayed)  Labs Reviewed  COMPREHENSIVE METABOLIC PANEL - Abnormal; Notable for the following components:      Result Value   Glucose, Bld 107 (*)    Calcium 8.7 (*)    All other components within normal limits  URINALYSIS, COMPLETE (UACMP) WITH MICROSCOPIC - Abnormal; Notable for the following components:   Color, Urine  YELLOW (*)    APPearance HAZY (*)    Hgb urine dipstick SMALL (*)    All other components within normal limits  TROPONIN I  CBC WITH DIFFERENTIAL/PLATELET  LIPASE, BLOOD  POCT PREGNANCY, URINE   ____________________________________________  EKG  ED ECG REPORT I, Loleta Roseory Pang Robers, the attending physician, personally viewed and interpreted this ECG.  Date: 01/30/2018 EKG Time: 00: 24 Rate: 65 Rhythm: normal sinus rhythm QRS Axis: normal Intervals: normal ST/T Wave abnormalities: normal Narrative Interpretation: no evidence of acute ischemia  ____________________________________________  RADIOLOGY I, Loleta Roseory Jennessa Trigo, personally viewed and evaluated these images (plain radiographs) as part of my medical decision making, as well as reviewing the written report by the radiologist.  ED MD interpretation: No indication of acute intra thoracic abnormality  Official radiology report(s): Dg Chest 2 View  Result Date: 01/30/2018 CLINICAL DATA:  Chest pain. EXAM: CHEST - 2 VIEW COMPARISON:  01/10/2018 FINDINGS: The cardiomediastinal contours are normal. The lungs are clear. Pulmonary vasculature is normal. No consolidation, pleural effusion, or pneumothorax. No acute osseous abnormalities are seen. IMPRESSION: No acute pulmonary process. Electronically Signed   By: Rubye Oaks M.D.   On: 01/30/2018 01:15    ____________________________________________   PROCEDURES  Critical Care performed: No   Procedure(s) performed:   Procedures   ____________________________________________   INITIAL IMPRESSION / ASSESSMENT AND PLAN / ED COURSE  As part of my medical decision making, I reviewed the following data within the electronic MEDICAL RECORD NUMBER Nursing notes reviewed and incorporated, Labs reviewed , EKG interpreted , Old chart reviewed and Notes from prior ED visits    Differential diagnosis includes, but is not limited to, acid reflux, biliary disease such as a retained stone  or a new hepatic stone in the common bile duct, gastritis, infectious process.  The patient is well-appearing and in no acute distress at this time.  Vital signs are stable.  A GI cocktail in triage relieved her symptoms.  Lab work including negative urine pregnancy, normal conference of metabolic panel, negative lipase, negative troponin, normal CBC with no leukocytosis, and urinalysis are all reassuring.  She does have some WBCs in her urine but she also has 6-10 squamous cells, nitrite negative, and no dysuria so I will not treat empirically.  I had my usual customary discussion of persistent acid reflux with the patient and I will prescribe a PPI, sucralfate, and Zofran.  I sent a message through [facility] to Dr. Tobi Bastos with GI to try to assist with outpatient follow-up and I encouraged the patient to call his office to schedule an appointment.  There is no indication for any emergent imaging such as a CT scan of the abdomen or pelvis at this time given the reassuring and benign work-up and physical exam.  She understands and agrees with the plan.   I gave my usual and customary return precautions.      ____________________________________________  FINAL CLINICAL IMPRESSION(S) / ED DIAGNOSES  Final diagnoses:  Epigastric pain  Gastroesophageal reflux disease, esophagitis presence not specified     MEDICATIONS GIVEN DURING THIS VISIT:  Medications  gi cocktail (Maalox,Lidocaine,Donnatal) (30 mLs Oral Given by Other 01/30/18 0130)     ED Discharge Orders        Ordered    sucralfate (CARAFATE) 1 g tablet  4 times daily PRN     01/30/18 0338    ondansetron (ZOFRAN ODT) 4 MG disintegrating tablet     01/30/18 0338    omeprazole (PRILOSEC) 20 MG capsule  2 times daily     01/30/18 1093       Note:  This document was prepared using Dragon voice recognition software and may include unintentional dictation errors.    Loleta Rose, MD 01/30/18 [phone]

## 2018-01-30 NOTE — ED Notes (Signed)
ED Provider at bedside. 

## 2018-01-30 NOTE — ED Triage Notes (Signed)
Patient ambulatory to triage with steady gait, without difficulty or distress noted; pt reports left upper abd "swelling" with pain radiating into chest accomp by heartburn and diarrhea x 2 days; st hx of same with dx adrenal mass

## 2018-01-30 NOTE — Discharge Instructions (Addendum)

## 2018-02-03 ENCOUNTER — Telehealth: Payer: Self-pay | Admitting: Gastroenterology

## 2018-02-03 NOTE — Telephone Encounter (Signed)
LVM for patient to call and schedule an ED follow up with Dr. Tobi Teresa Whitaker for reflux, bloating sensation. NO PCP

## 2018-02-04 ENCOUNTER — Encounter: Payer: Self-pay | Admitting: Gastroenterology

## 2018-02-04 ENCOUNTER — Telehealth: Payer: Self-pay | Admitting: Gastroenterology

## 2018-02-04 NOTE — Telephone Encounter (Signed)
LVM for patient to call and schedule an ED follow up with Dr. Tobi BastosAnna.  Reflux and bloating

## 2018-02-07 ENCOUNTER — Encounter: Payer: Self-pay | Admitting: Emergency Medicine

## 2018-02-07 ENCOUNTER — Emergency Department
Admission: EM | Admit: 2018-02-07 | Discharge: 2018-02-07 | Disposition: A | Discharge status: Home / Self Care | Payer: Self-pay | Attending: Emergency Medicine | Admitting: Emergency Medicine

## 2018-02-07 DIAGNOSIS — R531 Weakness: Secondary | ICD-10-CM | POA: Insufficient documentation

## 2018-02-07 DIAGNOSIS — Z7982 Long term (current) use of aspirin: Secondary | ICD-10-CM | POA: Insufficient documentation

## 2018-02-07 DIAGNOSIS — J45909 Unspecified asthma, uncomplicated: Secondary | ICD-10-CM | POA: Insufficient documentation

## 2018-02-07 DIAGNOSIS — T7840XA Allergy, unspecified, initial encounter: Secondary | ICD-10-CM | POA: Insufficient documentation

## 2018-02-07 DIAGNOSIS — R195 Other fecal abnormalities: Secondary | ICD-10-CM | POA: Insufficient documentation

## 2018-02-07 LAB — BASIC METABOLIC PANEL
ANION GAP: 6 (ref 5–15)
BUN: 13 mg/dL (ref 6–20)
CO2: 26 mmol/L (ref 22–32)
Calcium: 8.7 mg/dL — ABNORMAL LOW (ref 8.9–10.3)
Chloride: 107 mmol/L (ref 101–111)
Creatinine, Ser: 0.59 mg/dL (ref 0.44–1.00)
Glucose, Bld: 93 mg/dL (ref 65–99)
POTASSIUM: 3.4 mmol/L — AB (ref 3.5–5.1)
SODIUM: 139 mmol/L (ref 135–145)

## 2018-02-07 LAB — URINALYSIS, ROUTINE W REFLEX MICROSCOPIC
Bilirubin Urine: NEGATIVE
Glucose, UA: NEGATIVE mg/dL
HGB URINE DIPSTICK: NEGATIVE
Ketones, ur: NEGATIVE mg/dL
LEUKOCYTES UA: NEGATIVE
NITRITE: NEGATIVE
PROTEIN: NEGATIVE mg/dL
SPECIFIC GRAVITY, URINE: 1.024 (ref 1.005–1.030)
pH: 5 (ref 5.0–8.0)

## 2018-02-07 LAB — CBC WITH DIFFERENTIAL/PLATELET
BASOS ABS: 0 10*3/uL (ref 0–0.1)
BASOS PCT: 1 %
EOS ABS: 0.1 10*3/uL (ref 0–0.7)
Eosinophils Relative: 2 %
HCT: 41 % (ref 35.0–47.0)
HEMOGLOBIN: 14.3 g/dL (ref 12.0–16.0)
LYMPHS ABS: 2.1 10*3/uL (ref 1.0–3.6)
Lymphocytes Relative: 31 %
MCH: 29.9 pg (ref 26.0–34.0)
MCHC: 35 g/dL (ref 32.0–36.0)
MCV: 85.6 fL (ref 80.0–100.0)
Monocytes Absolute: 0.4 10*3/uL (ref 0.2–0.9)
Monocytes Relative: 6 %
NEUTROS PCT: 60 %
Neutro Abs: 4.1 10*3/uL (ref 1.4–6.5)
Platelets: 341 10*3/uL (ref 150–440)
RBC: 4.79 MIL/uL (ref 3.80–5.20)
RDW: 12.9 % (ref 11.5–14.5)
WBC: 6.8 10*3/uL (ref 3.6–11.0)

## 2018-02-07 LAB — POC URINE PREG, ED: PREG TEST UR: NEGATIVE

## 2018-02-07 MED ORDER — DIPHENHYDRAMINE HCL 25 MG PO CAPS
25.0000 mg | ORAL_CAPSULE | Freq: Once | ORAL | Status: AC
  Administered 2018-02-07: 25 mg via ORAL
  Filled 2018-02-07: qty 1

## 2018-02-07 MED ORDER — SODIUM CHLORIDE 0.9 % IV BOLUS
1000.0000 mL | Freq: Once | INTRAVENOUS | Status: AC
  Administered 2018-02-07: 1000 mL via INTRAVENOUS

## 2018-02-07 MED ORDER — PREDNISONE 20 MG PO TABS: 40.0000 mg | ORAL_TABLET | Freq: Every day | ORAL | 0 refills | Status: DC

## 2018-02-07 NOTE — ED Provider Notes (Signed)
[facility] Emergency Department Provider Note  Time seen: 5:35 PM  I have reviewed the triage vital signs and the nursing notes.   HISTORY  Chief Complaint Medication Reaction    HPI Teresa Whitaker is a 47 y.o. female with a past medical history of asthma, TIA, prior brain aneurysm, presents to the emergency department for lightheadedness concerns of allergic reaction.  According to the patient for the past 2 days she has been experiencing intermittent tingling in her tongue, states she had hives that popped up yesterday and again this morning, received Benadryl and the hives have since gone away per patient.  Denies any shortness of breath at any point.  States she has been feeling somewhat lightheaded and weak as well for the past 2 days.  States she took her blood pressure medication this morning and since doing that she has noticed that her stools turned dark in color.  She does admit approximately 3 days ago took Pepto-Bismol for some loose stool she was having which she states is a fairly chronic condition for her.  Denies any vomiting but states intermittent nausea.  Denies any fever.  No abdominal pain or chest pain.  Patient states she has been on her blood pressure medication for several weeks now without any ill effect until today.   Past Medical History:  Diagnosis Date  . Adrenal mass (HCC)   . Asthma   . Brain aneurysm   . TIA (transient ischemic attack)     Patient Active Problem List   Diagnosis Date Noted  . TIA (transient ischemic attack) 01/10/2018    Past Surgical History:  Procedure Laterality Date  . CARDIAC CATHETERIZATION    . CESAREAN SECTION    . CHOLECYSTECTOMY      Prior to Admission medications   Medication Sig Start Date End Date Taking? Authorizing Provider  acetaminophen (TYLENOL) 650 MG CR tablet Take 650 mg by mouth every 8 (eight) hours as needed for pain.    [provider]  albuterol (PROVENTIL HFA;VENTOLIN  HFA) 108 (90 Base) MCG/ACT inhaler Inhale 2 puffs into the lungs 4 (four) times daily.    [provider]  amLODipine (NORVASC) 10 MG tablet Take 1 tablet (10 mg total) by mouth daily. 01/11/18 01/11/19  Katha Hamming, MD  aspirin EC 81 MG tablet Take 81-162 mg by mouth daily.    [provider]  Butalbital-APAP-Caffeine (FIORICET) 50-300-40 MG CAPS Take 1 capsule by mouth every 6 (six) hours as needed (headache (max 3 per episode/10 per week)). 01/11/18   Katha Hamming, MD  diclofenac (VOLTAREN) 75 MG EC tablet Take 75 mg by mouth 2 (two) times daily.    [provider]  docusate sodium (COLACE) 100 MG capsule Take 1 capsule (100 mg total) by mouth 2 (two) times daily as needed for mild constipation. 01/11/18   Salary, Evelena Asa, MD  omeprazole (PRILOSEC) 20 MG capsule Take 1 capsule (20 mg total) by mouth 2 (two) times daily. 01/30/18 01/30/19  Loleta Rose, MD  ondansetron (ZOFRAN ODT) 4 MG disintegrating tablet Allow 1-2 tablets to dissolve in your mouth every 8 hours as needed for nausea/vomiting 01/30/18   Loleta Rose, MD  ranitidine (ZANTAC) 150 MG tablet Take 150 mg by mouth 2 (two) times daily.    [provider]  sucralfate (CARAFATE) 1 g tablet Take 1 tablet (1 g total) by mouth 4 (four) times daily as needed (for abdominal discomfort, nausea, and/or vomiting). 01/30/18   Loleta Rose, MD  traMADol (ULTRAM) 50 MG tablet Take 50 mg by mouth every 4 (four) hours as needed for moderate pain.    [provider]    Allergies  Allergen Reactions  . Amoxicillin Hives  . Bactrim [Sulfamethoxazole-Trimethoprim]     No family history on file.  Social History Social History   Tobacco Use  . Smoking status: Never Smoker  . Smokeless tobacco: Never Used  Substance Use Topics  . Alcohol use: Not Currently  . Drug use: Not Currently    Review of Systems Constitutional: Negative for fever. Eyes: Negative for visual complaints ENT:  States tingling in her tongue and throat earlier today, now gone. Cardiovascular: Negative for chest pain. Respiratory: Negative for shortness of breath. Gastrointestinal: Negative for abdominal pain states intermittent nausea.  Denies vomiting.  States loose stool which is fairly chronic but dark stool today which is abnormal. Genitourinary: Negative for urinary compaints Musculoskeletal: Negative for musculoskeletal complaints Skin: States hives earlier today which went away after taking Benadryl. Neurological: Negative for headache All other ROS negative  ____________________________________________   PHYSICAL EXAM:  VITAL SIGNS: ED Triage Vitals [02/07/18 1502]  Enc Vitals Group     BP 117/80     Pulse Rate 82     Resp 18     Temp 98.8 F (37.1 C)     Temp Source Oral     SpO2 97 %     Weight 210 lb (95.3 kg)     Height 5\' 4"  (1.626 m)     Head Circumference      Peak Flow      Pain Score 6     Pain Loc      Pain Edu?      Excl. in GC?    Constitutional: Alert and oriented. Well appearing and in no distress. Eyes: Normal exam ENT   Head: Normocephalic and atraumatic   Mouth/Throat: Mucous membranes are moist.  No oral or pharyngeal edema. Cardiovascular: Normal rate, regular rhythm. No murmur Respiratory: Normal respiratory effort without tachypnea nor retractions. Breath sounds are clear  Gastrointestinal: Soft and nontender. No distention.   Musculoskeletal: Nontender with normal range of motion in all extremities.  Neurologic:  Normal speech and language. No gross focal neurologic deficits Skin:  Skin is warm, dry and intact.  No rash noted. Psychiatric: Mood and affect are normal.   ____________________________________________   INITIAL IMPRESSION / ASSESSMENT AND PLAN / ED COURSE  Pertinent labs & imaging results that were available during my care of the patient were reviewed by me and considered in my medical decision making (see chart for  details).  Patient presents emergency department with concerns of a possible allergic reaction as well as lightheaded and weakness.  Differential is quite broad but would include allergic reaction, metabolic or electrolyte abnormality, with a dark stool we would be concerned about symptomatic anemia, GI bleed.  Patient does state proximal me 4 days ago she took Pepto-Bismol for her loose stool, this could account for the darkness of her stool today we will perform a rectal examination guaiac testing.  Patient's basic lab work is largely within normal limits including H&H as well as electrolytes.  We will IV hydrate.  Patient states all of her symptoms went away after taking Benadryl, this could possibly indicate an allergic reaction although she has no hives or any other indication at this time is difficult for me to assess this.  Given the possibility of an allergic reaction we will cover with prednisone.  Patient states she has been on all of her medications for at least several weeks, highly doubt a medication allergic reaction but I told the patient to discuss this further with her PCP whether or not she should continue this medication.  Rectal examination shows dark brown stool but guaiac negative.  Urinalysis pending.  Patient receiving IV fluids.  Overall the patient appears well anticipate likely discharge home.  Analysis negative.  Patient feeling better after fluids.  We will discharge patient home with PCP follow-up.  ____________________________________________   FINAL CLINICAL IMPRESSION(S) / ED DIAGNOSES  Allergic reaction weakness    Minna AntisPaduchowski, Tanaia Hawkey, MD 02/07/18 1929

## 2018-02-07 NOTE — ED Notes (Signed)
First Nurse Note: Pt states that she started new BP med in May, thinks she may be having an allergic reaction to the medication. Pt states that she is breaking out on her hands and feels like her tongue is swelling. Pt is in NAD at this time. No respiratory distress noted.

## 2018-02-07 NOTE — ED Triage Notes (Signed)
Patient presents to the ED with itching, tongue swelling, feeling "off".  Patient started Norvasc memorial day weekend and states since starting to take it she has been having black diarrhea and other issues but main reaction began after taking it last night.  Patient states, "I felt really bad last night."  Patient took 2 tablets of benadryl around 9:30pm.

## 2018-02-07 NOTE — ED Triage Notes (Addendum)
Patient reports feeling short of breath.  Patient speaking in full sentences without any difficulty at this time.

## 2018-02-07 NOTE — ED Notes (Signed)
Pt to first nurse desk, states that she just went to the rest room and her diarrhea was bloody. Pt is in NAD at this time.

## 2018-03-07 ENCOUNTER — Emergency Department: Payer: Self-pay

## 2018-03-07 ENCOUNTER — Emergency Department
Admission: EM | Admit: 2018-03-07 | Discharge: 2018-03-07 | Disposition: A | Discharge status: Home / Self Care | Payer: Self-pay | Attending: Emergency Medicine | Admitting: Emergency Medicine

## 2018-03-07 ENCOUNTER — Other Ambulatory Visit: Payer: Self-pay

## 2018-03-07 DIAGNOSIS — Z79899 Other long term (current) drug therapy: Secondary | ICD-10-CM | POA: Insufficient documentation

## 2018-03-07 DIAGNOSIS — R51 Headache: Secondary | ICD-10-CM | POA: Insufficient documentation

## 2018-03-07 DIAGNOSIS — Z7982 Long term (current) use of aspirin: Secondary | ICD-10-CM | POA: Insufficient documentation

## 2018-03-07 DIAGNOSIS — F439 Reaction to severe stress, unspecified: Secondary | ICD-10-CM

## 2018-03-07 DIAGNOSIS — Z8673 Personal history of transient ischemic attack (TIA), and cerebral infarction without residual deficits: Secondary | ICD-10-CM | POA: Insufficient documentation

## 2018-03-07 DIAGNOSIS — M79602 Pain in left arm: Secondary | ICD-10-CM | POA: Insufficient documentation

## 2018-03-07 DIAGNOSIS — R0789 Other chest pain: Secondary | ICD-10-CM | POA: Insufficient documentation

## 2018-03-07 DIAGNOSIS — R079 Chest pain, unspecified: Secondary | ICD-10-CM

## 2018-03-07 DIAGNOSIS — J45909 Unspecified asthma, uncomplicated: Secondary | ICD-10-CM | POA: Insufficient documentation

## 2018-03-07 LAB — BASIC METABOLIC PANEL
Anion gap: 7 (ref 5–15)
BUN: 9 mg/dL (ref 6–20)
CHLORIDE: 109 mmol/L (ref 98–111)
CO2: 25 mmol/L (ref 22–32)
Calcium: 8.7 mg/dL — ABNORMAL LOW (ref 8.9–10.3)
Creatinine, Ser: 0.68 mg/dL (ref 0.44–1.00)
GFR calc Af Amer: >60 mL/min (ref >60)
GFR calc non Af Amer: >60 mL/min (ref >60)
GLUCOSE: 110 mg/dL — AB (ref 70–99)
POTASSIUM: 3.8 mmol/L (ref 3.5–5.1)
Sodium: 141 mmol/L (ref 135–145)

## 2018-03-07 LAB — CBC
HEMATOCRIT: 39.6 % (ref 35.0–47.0)
Hemoglobin: 14 g/dL (ref 12.0–16.0)
MCH: 30 pg (ref 26.0–34.0)
MCHC: 35.4 g/dL (ref 32.0–36.0)
MCV: 84.7 fL (ref 80.0–100.0)
Platelets: 348 10*3/uL (ref 150–440)
RBC: 4.68 MIL/uL (ref 3.80–5.20)
RDW: 12.8 % (ref 11.5–14.5)
WBC: 8.7 10*3/uL (ref 3.6–11.0)

## 2018-03-07 LAB — TROPONIN I
Troponin I: <0.03 ng/mL (ref <0.03)
Troponin I: <0.03 ng/mL (ref <0.03)

## 2018-03-07 MED ORDER — BUTALBITAL-APAP-CAFFEINE 50-325-40 MG PO TABS
2.0000 | ORAL_TABLET | Freq: Once | ORAL | Status: AC
  Administered 2018-03-07: 2 via ORAL
  Filled 2018-03-07: qty 2

## 2018-03-07 NOTE — ED Provider Notes (Signed)
Teresa Whitaker  Time seen: 4:40 PM  I have reviewed the triage vital signs and the nursing notes.   HISTORY  Chief Complaint Chest Pain and Headache    HPI Teresa Whitaker is a 47 y.o. female with a past medical history of TIA, brain aneurysm, presents to the emergency department for headache, chest pain, left arm pain.  According to the patient approximately 4 hours ago she developed a headache, she believes is because she has been dehydrated recently from working outside.  States she has not had a headache this severe for at least 2 weeks.  Describes as moderate to severe currently.  Global dull headache.  States she frequently gets headaches but today she is also experiencing chest pain.  States she frequently gets chest pain as well but today was also experiencing left arm pain which is abnormal for the patient so she came to the emergency department.  States the chest pain started approximately 3 to 4 hours ago, headache started proximal me 4 hours ago.  Patient believes that this is all stress-induced, states she has been having a lot of issues with her roommates lately, states she has to move out today, try to call at home a shelter but was unsuccessful in getting up with them, so she came to the emergency department to see if we could help.   Past Medical History:  Diagnosis Date  . Adrenal mass (HCC)   . Asthma   . Brain aneurysm   . TIA (transient ischemic attack)     Patient Active Problem List   Diagnosis Date Noted  . TIA (transient ischemic attack) 01/10/2018    Past Surgical History:  Procedure Laterality Date  . CARDIAC CATHETERIZATION    . CESAREAN SECTION    . CHOLECYSTECTOMY      Prior to Admission medications   Medication Sig Start Date End Date Taking? Authorizing Provider  acetaminophen (TYLENOL) 650 MG CR tablet Take 650 mg by mouth every 8 (eight) hours as needed for pain.    [provider]   albuterol (PROVENTIL HFA;VENTOLIN HFA) 108 (90 Base) MCG/ACT inhaler Inhale 2 puffs into the lungs 4 (four) times daily.    [provider]  amLODipine (NORVASC) 10 MG tablet Take 1 tablet (10 mg total) by mouth daily. 01/11/18 01/11/19  Katha HammingKonidena, Snehalatha, MD  aspirin EC 81 MG tablet Take 81-162 mg by mouth daily.    [provider]  Butalbital-APAP-Caffeine (FIORICET) 50-300-40 MG CAPS Take 1 capsule by mouth every 6 (six) hours as needed (headache (max 3 per episode/10 per week)). 01/11/18   Katha HammingKonidena, Snehalatha, MD  diclofenac (VOLTAREN) 75 MG EC tablet Take 75 mg by mouth 2 (two) times daily.    [provider]  docusate sodium (COLACE) 100 MG capsule Take 1 capsule (100 mg total) by mouth 2 (two) times daily as needed for mild constipation. 01/11/18   Salary, Evelena AsaMontell D, MD  omeprazole (PRILOSEC) 20 MG capsule Take 1 capsule (20 mg total) by mouth 2 (two) times daily. 01/30/18 01/30/19  Loleta RoseForbach, Cory, MD  ondansetron (ZOFRAN ODT) 4 MG disintegrating tablet Allow 1-2 tablets to dissolve in your mouth every 8 hours as needed for nausea/vomiting 01/30/18   Loleta RoseForbach, Cory, MD  predniSONE (DELTASONE) 20 MG tablet Take 2 tablets (40 mg total) by mouth daily. 02/07/18   Minna AntisPaduchowski, Patsye Sullivant, MD  ranitidine (ZANTAC) 150 MG tablet Take 150 mg by mouth 2 (two) times daily.    [provider]  sucralfate (CARAFATE) 1 g tablet Take 1 tablet (1 g total) by mouth 4 (four) times daily as needed (for abdominal discomfort, nausea, and/or vomiting). 01/30/18   Loleta Rose, MD  traMADol (ULTRAM) 50 MG tablet Take 50 mg by mouth every 4 (four) hours as needed for moderate pain.    [provider]    Allergies  Allergen Reactions  . Amoxicillin Hives  . Bactrim [Sulfamethoxazole-Trimethoprim]     No family history on file.  Social History Social History   Tobacco Use  . Smoking status: Never Smoker  . Smokeless tobacco: Never Used  Substance Use Topics  . Alcohol  use: Not Currently  . Drug use: Not Currently    Review of Systems Constitutional: Negative for fever. Eyes: Negative for visual complaints ENT: Negative for recent illness/congestion Cardiovascular: Intermittent chest pain for the past 3 to 4 hours Respiratory: Negative for shortness of breath. Gastrointestinal: Negative for abdominal pain, vomiting and diarrhea. Genitourinary: Negative for urinary compaints Musculoskeletal: Left arm pain Skin: Negative for skin complaints  Neurological: Moderate global headache All other ROS negative  ____________________________________________   PHYSICAL EXAM:  VITAL SIGNS: ED Triage Vitals [03/07/18 1438]  Enc Vitals Group     BP 110/63     Pulse Rate 60     Resp 14     Temp 98.6 F (37 C)     Temp Source Oral     SpO2 98 %     Weight 207 lb (93.9 kg)     Height 5\' 4"  (1.626 m)     Head Circumference      Peak Flow      Pain Score 7     Pain Loc      Pain Edu?      Excl. in GC?    Constitutional: Alert and oriented. Well appearing and in no distress. Eyes: Normal exam ENT   Head: Normocephalic and atraumatic.   Mouth/Throat: Mucous membranes are moist. Cardiovascular: Normal rate, regular rhythm. No murmur Respiratory: Normal respiratory effort without tachypnea nor retractions. Breath sounds are clear  Gastrointestinal: Soft and nontender. No distention.   Musculoskeletal: Nontender with normal range of motion in all extremities. No lower extremity tenderness or edema. Neurologic:  Normal speech and language. No gross focal neurologic deficits.  Equal grip strength bilaterally.  No pronator drift.  No lower extremity drift, 5/5 motor in all extremities.  No cranial nerve deficit. Skin:  Skin is warm, dry and intact.  Psychiatric: Somewhat tearful.  ____________________________________________    EKG  EKG reviewed and interpreted by myself shows normal sinus rhythm at 63 bpm with a narrow QRS, normal axis, normal  intervals, no ST changes noted.  ____________________________________________    RADIOLOGY  Chest x-ray normal  ____________________________________________   INITIAL IMPRESSION / ASSESSMENT AND PLAN / ED COURSE  Pertinent labs & imaging results that were available during my care of the patient were reviewed by me and considered in my medical decision making (see chart for details).  Patient presented to the emergency department for headache, chest pain, left arm pain, and homelessness.  States she needs to move out of her living situation today, had not been able to find a homeless shelter.  States she has been under tremendous amount of stress, reports a headache for the past 4 hours with intermittent chest pains as well as left arm pain over the same time..  States she frequently gets chest pain as well as headaches, but usually she does  not get a chest pain with a headache and left arm pain.  Currently the patient appears well, normal examination including normal neurological examination.  Lab work is reassuring.  EKG is normal.  Chest x-ray is normal.  We will repeat a second troponin.  We will have social worker see the patient to provide outpatient resources for the patient.  Patient agreeable to this plan of care.  Repeat troponin is negative.  Social worker has spoken to the patient provided resources for the patient.  Patient is feeling much better.  ____________________________________________   FINAL CLINICAL IMPRESSION(S) / ED DIAGNOSES  Headache Chest pain    Minna Antis, MD 03/07/18 1918

## 2018-03-07 NOTE — ED Triage Notes (Signed)
C/o chest pain to left side and down left arm, tingling to left arm and headache. Sx started approx 2 hours ago. Pt alert and oriented X4, active, cooperative, pt in NAD. RR even and unlabored, color WNL.  Equal grip strength. Pt ambulates with ease. Speech clear. Facial symmetry.

## 2018-03-07 NOTE — ED Notes (Signed)
Spoke with Dr Mayford Knifewilliams about pts sx's- Headache that began 2 hrs pta and numbness to left hand that began 1 hr pta with chest pain. Per MD order- Chest pain protocol to be completed and hold off on CT until MD assessment.
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
## 2018-03-14 ENCOUNTER — Other Ambulatory Visit: Payer: Self-pay

## 2018-03-14 ENCOUNTER — Encounter: Payer: Self-pay | Admitting: Emergency Medicine

## 2018-03-14 ENCOUNTER — Emergency Department
Admission: EM | Admit: 2018-03-14 | Discharge: 2018-03-15 | Disposition: A | Discharge status: Home / Self Care | Payer: Medicaid Other | Attending: Emergency Medicine | Admitting: Emergency Medicine

## 2018-03-14 ENCOUNTER — Emergency Department: Payer: Medicaid Other

## 2018-03-14 DIAGNOSIS — Z8673 Personal history of transient ischemic attack (TIA), and cerebral infarction without residual deficits: Secondary | ICD-10-CM | POA: Insufficient documentation

## 2018-03-14 DIAGNOSIS — R200 Anesthesia of skin: Secondary | ICD-10-CM | POA: Insufficient documentation

## 2018-03-14 DIAGNOSIS — R51 Headache: Secondary | ICD-10-CM | POA: Insufficient documentation

## 2018-03-14 DIAGNOSIS — R110 Nausea: Secondary | ICD-10-CM | POA: Insufficient documentation

## 2018-03-14 DIAGNOSIS — J45909 Unspecified asthma, uncomplicated: Secondary | ICD-10-CM | POA: Insufficient documentation

## 2018-03-14 DIAGNOSIS — Z79899 Other long term (current) drug therapy: Secondary | ICD-10-CM | POA: Insufficient documentation

## 2018-03-14 DIAGNOSIS — R519 Headache, unspecified: Secondary | ICD-10-CM

## 2018-03-14 DIAGNOSIS — Z7982 Long term (current) use of aspirin: Secondary | ICD-10-CM | POA: Insufficient documentation

## 2018-03-14 LAB — COMPREHENSIVE METABOLIC PANEL
ALBUMIN: 3.3 g/dL — AB (ref 3.5–5.0)
ALK PHOS: 35 U/L — AB (ref 38–126)
ALT: 20 U/L (ref 0–44)
ANION GAP: 5 (ref 5–15)
AST: 15 U/L (ref 15–41)
BILIRUBIN TOTAL: 0.8 mg/dL (ref 0.3–1.2)
BUN: 12 mg/dL (ref 6–20)
CALCIUM: 8.3 mg/dL — AB (ref 8.9–10.3)
CO2: 27 mmol/L (ref 22–32)
CREATININE: 0.69 mg/dL (ref 0.44–1.00)
Chloride: 108 mmol/L (ref 98–111)
GFR calc Af Amer: >60 mL/min (ref >60)
GFR calc non Af Amer: >60 mL/min (ref >60)
GLUCOSE: 103 mg/dL — AB (ref 70–99)
Potassium: 3.5 mmol/L (ref 3.5–5.1)
Sodium: 140 mmol/L (ref 135–145)
Total Protein: 5.6 g/dL — ABNORMAL LOW (ref 6.5–8.1)

## 2018-03-14 LAB — DIFFERENTIAL
Basophils Absolute: 0.1 10*3/uL (ref 0–0.1)
Basophils Relative: 1 %
EOS PCT: 1 %
Eosinophils Absolute: 0.1 10*3/uL (ref 0–0.7)
LYMPHS ABS: 1.9 10*3/uL (ref 1.0–3.6)
LYMPHS PCT: 24 %
Monocytes Absolute: 0.6 10*3/uL (ref 0.2–0.9)
Monocytes Relative: 7 %
NEUTROS ABS: 5.3 10*3/uL (ref 1.4–6.5)
NEUTROS PCT: 67 %

## 2018-03-14 LAB — CBC
HCT: 39.8 % (ref 35.0–47.0)
HEMOGLOBIN: 13.7 g/dL (ref 12.0–16.0)
MCH: 29 pg (ref 26.0–34.0)
MCHC: 34.3 g/dL (ref 32.0–36.0)
MCV: 84.5 fL (ref 80.0–100.0)
Platelets: 297 10*3/uL (ref 150–440)
RBC: 4.72 MIL/uL (ref 3.80–5.20)
RDW: 12.9 % (ref 11.5–14.5)
WBC: 8 10*3/uL (ref 3.6–11.0)

## 2018-03-14 LAB — TROPONIN I: Troponin I: <0.03 ng/mL (ref <0.03)

## 2018-03-14 LAB — PROTIME-INR
INR: 0.99
Prothrombin Time: 13 seconds (ref 11.4–15.2)

## 2018-03-14 LAB — APTT: aPTT: 27 seconds (ref 24–36)

## 2018-03-14 MED ORDER — KETOROLAC TROMETHAMINE 30 MG/ML IJ SOLN
15.0000 mg | Freq: Once | INTRAMUSCULAR | Status: AC
  Administered 2018-03-14: 15 mg via INTRAVENOUS
  Filled 2018-03-14: qty 1

## 2018-03-14 MED ORDER — FENTANYL CITRATE (PF) 100 MCG/2ML IJ SOLN
100.0000 ug | Freq: Once | INTRAMUSCULAR | Status: AC
  Administered 2018-03-14: 100 ug via INTRAVENOUS
  Filled 2018-03-14: qty 2

## 2018-03-14 MED ORDER — DIHYDROERGOTAMINE MESYLATE 1 MG/ML IJ SOLN
1.0000 mg | Freq: Once | INTRAMUSCULAR | Status: AC
  Administered 2018-03-14: 1 mg via INTRAVENOUS
  Filled 2018-03-14: qty 1

## 2018-03-14 MED ORDER — PROCHLORPERAZINE EDISYLATE 10 MG/2ML IJ SOLN
10.0000 mg | Freq: Once | INTRAMUSCULAR | Status: AC
  Administered 2018-03-14: 10 mg via INTRAVENOUS
  Filled 2018-03-14: qty 2

## 2018-03-14 MED ORDER — IOPAMIDOL (ISOVUE-370) INJECTION 76%
75.0000 mL | Freq: Once | INTRAVENOUS | Status: AC | PRN
  Administered 2018-03-14: 75 mL via INTRAVENOUS

## 2018-03-14 MED ORDER — DIPHENHYDRAMINE HCL 50 MG/ML IJ SOLN
50.0000 mg | Freq: Once | INTRAMUSCULAR | Status: AC
  Administered 2018-03-14: 50 mg via INTRAVENOUS
  Filled 2018-03-14: qty 1

## 2018-03-14 MED ORDER — SUMATRIPTAN SUCCINATE 50 MG PO TABS: 50.0000 mg | ORAL_TABLET | Freq: Once | ORAL | 0 refills | Status: DC | PRN

## 2018-03-14 MED ORDER — DEXAMETHASONE SODIUM PHOSPHATE 10 MG/ML IJ SOLN
10.0000 mg | Freq: Once | INTRAMUSCULAR | Status: AC
  Administered 2018-03-14: 10 mg via INTRAVENOUS
  Filled 2018-03-14: qty 1

## 2018-03-14 NOTE — Discharge Instructions (Signed)
Please take over the counter magnesium daily to help prevent headaches.  Make sure you make an appointment to establish care with either Dr. Malvin Johns or Dr. Sherryll Burger for further chronic headache care.  Return to the ED for any concerns.  It was a pleasure to take care of you today, and thank you for coming to our emergency department.  If you have any questions or concerns before leaving please ask the nurse to grab me and I'm more than happy to go through your aftercare instructions again.  If you were prescribed any opioid pain medication today such as Norco, Vicodin, Percocet, morphine, hydrocodone, or oxycodone please make sure you do not drive when you are taking this medication as it can alter your ability to drive safely.  If you have any concerns once you are home that you are not improving or are in fact getting worse before you can make it to your follow-up appointment, please do not hesitate to call 911 and come back for further evaluation.  Merrily Brittle, MD  Results for orders placed or performed during the hospital encounter of 03/14/18  Protime-INR  Result Value Ref Range   Prothrombin Time 13.0 11.4 - 15.2 seconds   INR 0.99   APTT  Result Value Ref Range   aPTT 27 24 - 36 seconds  CBC  Result Value Ref Range   WBC 8.0 3.6 - 11.0 K/uL   RBC 4.72 3.80 - 5.20 MIL/uL   Hemoglobin 13.7 12.0 - 16.0 g/dL   HCT 16.1 09.6 - 04.5 %   MCV 84.5 80.0 - 100.0 fL   MCH 29.0 26.0 - 34.0 pg   MCHC 34.3 32.0 - 36.0 g/dL   RDW 40.9 81.1 - 91.4 %   Platelets 297 150 - 440 K/uL  Differential  Result Value Ref Range   Neutrophils Relative % 67 %   Neutro Abs 5.3 1.4 - 6.5 K/uL   Lymphocytes Relative 24 %   Lymphs Abs 1.9 1.0 - 3.6 K/uL   Monocytes Relative 7 %   Monocytes Absolute 0.6 0.2 - 0.9 K/uL   Eosinophils Relative 1 %   Eosinophils Absolute 0.1 0 - 0.7 K/uL   Basophils Relative 1 %   Basophils Absolute 0.1 0 - 0.1 K/uL  Comprehensive metabolic panel  Result Value Ref Range     Sodium 140 135 - 145 mmol/L   Potassium 3.5 3.5 - 5.1 mmol/L   Chloride 108 98 - 111 mmol/L   CO2 27 22 - 32 mmol/L   Glucose, Bld 103 (H) 70 - 99 mg/dL   BUN 12 6 - 20 mg/dL   Creatinine, Ser 7.82 0.44 - 1.00 mg/dL   Calcium 8.3 (L) 8.9 - 10.3 mg/dL   Total Protein 5.6 (L) 6.5 - 8.1 g/dL   Albumin 3.3 (L) 3.5 - 5.0 g/dL   AST 15 15 - 41 U/L   ALT 20 0 - 44 U/L   Alkaline Phosphatase 35 (L) 38 - 126 U/L   Total Bilirubin 0.8 0.3 - 1.2 mg/dL   GFR calc non Af Amer >60 >60 mL/min   GFR calc Af Amer >60 >60 mL/min   Anion gap 5 5 - 15  Troponin I  Result Value Ref Range   Troponin I <0.03 <0.03 ng/mL   Ct Angio Head W Or Wo Contrast  Result Date: 03/14/2018 CLINICAL DATA:  Acute onset headache at 1630 hours today. LEFT facial and LEFT arm pain beginning at 2130 hours. History of migraines,  TIA, brain aneurysm. EXAM: CT ANGIOGRAPHY HEAD AND NECK TECHNIQUE: Multidetector CT imaging of the head and neck was performed using the standard protocol during bolus administration of intravenous contrast. Multiplanar CT image reconstructions and MIPs were obtained to evaluate the vascular anatomy. Carotid stenosis measurements (when applicable) are obtained utilizing NASCET criteria, using the distal internal carotid diameter as the denominator. CONTRAST:  75mL ISOVUE-370 IOPAMIDOL (ISOVUE-370) INJECTION 76% COMPARISON:  MRI of the head March 13, 2018, CT HEAD and CTA HEAD and neck Jan 10, 2018. FINDINGS: CT HEAD FINDINGS BRAIN: No intraparenchymal hemorrhage, mass effect nor midline shift. The ventricles and sulci are normal. No acute large vascular territory infarcts. No abnormal extra-axial fluid collections. Basal cisterns are patent. VASCULAR: Unremarkable. SKULL/SOFT TISSUES: No skull fracture. No significant soft tissue swelling. ORBITS/SINUSES: The included ocular globes and orbital contents are normal.The mastoid aircells and included paranasal sinuses are well-aerated. OTHER: None. CTA NECK  FINDINGS: AORTIC ARCH: Normal appearance of the thoracic arch, normal branch pattern. The origins of the innominate, left Common carotid artery and subclavian artery are widely patent. RIGHT CAROTID SYSTEM: Common carotid artery is patent. Normal appearance of the carotid bifurcation without hemodynamically significant stenosis by NASCET criteria. Normal appearance of the internal carotid artery. LEFT CAROTID SYSTEM: Common carotid artery is patent. Normal appearance of the carotid bifurcation without hemodynamically significant stenosis by NASCET criteria. Normal appearance of the internal carotid artery, tonsillar loop. VERTEBRAL ARTERIES:Left vertebral artery is dominant. Normal appearance of the vertebral arteries, widely patent. SKELETON: No acute osseous process though bone windows have not been submitted. OTHER NECK: Soft tissues of the neck are nonacute though, not tailored for evaluation. UPPER CHEST: Included lung apices are clear. No superior mediastinal lymphadenopathy. CTA HEAD FINDINGS: ANTERIOR CIRCULATION: Patent cervical internal carotid arteries, petrous, cavernous and supra clinoid internal carotid arteries. Patent anterior communicating artery. Patent anterior and middle cerebral arteries. No large vessel occlusion, significant stenosis, contrast extravasation or aneurysm. POSTERIOR CIRCULATION: Patent vertebral arteries, vertebrobasilar junction and basilar artery, as well as main branch vessels. Patent posterior cerebral arteries. Small LEFT posterior communicating artery present. No large vessel occlusion, significant stenosis, contrast extravasation or aneurysm. VENOUS SINUSES: Major dural venous sinuses are patent though not tailored for evaluation on this angiographic examination. ANATOMIC VARIANTS: Supernumerary anterior cerebral artery RIGHT A1 2 junction. DELAYED PHASE: No suspicious intracranial enhancement. 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  MIP images reviewed. IMPRESSION: 1. Negative CT HEAD with and without contrast. 2. Negative CTA HEAD and CTA NECK. Electronically Signed   By: Awilda Metroourtnay  Bloomer M.D.   On: 03/14/2018 22:43   Dg Chest 2 View  Result Date: 03/07/2018 CLINICAL DATA:  Chest pain with hypertension and dizziness EXAM: CHEST - 2 VIEW COMPARISON:  January 30, 2018 and Jan 10, 2018 FINDINGS: There is no edema or consolidation. The heart size and pulmonary vascularity are normal. No adenopathy. No bone lesions. No pneumothorax. There are surgical clips in the upper abdomen. IMPRESSION: No edema or consolidation. Electronically Signed   By: Bretta BangWilliam  Woodruff III M.D.   On: 03/07/2018 15:16   Ct Angio Neck W And/or Wo Contrast  Result Date: 03/14/2018 CLINICAL DATA:  Acute onset headache at 1630 hours today. LEFT facial and LEFT arm pain beginning at 2130 hours. History of migraines, TIA, brain aneurysm. EXAM: CT ANGIOGRAPHY HEAD AND NECK TECHNIQUE: Multidetector CT imaging of the head and neck was performed using the standard protocol during bolus administration of intravenous contrast. Multiplanar CT image reconstructions and MIPs were  obtained to evaluate the vascular anatomy. Carotid stenosis measurements (when applicable) are obtained utilizing NASCET criteria, using the distal internal carotid diameter as the denominator. CONTRAST:  75mL ISOVUE-370 IOPAMIDOL (ISOVUE-370) INJECTION 76% COMPARISON:  MRI of the head March 13, 2018, CT HEAD and CTA HEAD and neck Jan 10, 2018. FINDINGS: CT HEAD FINDINGS BRAIN: No intraparenchymal hemorrhage, mass effect nor midline shift. The ventricles and sulci are normal. No acute large vascular territory infarcts. No abnormal extra-axial fluid collections. Basal cisterns are patent. VASCULAR: Unremarkable. SKULL/SOFT TISSUES: No skull fracture. No significant soft tissue swelling. ORBITS/SINUSES: The included ocular globes and orbital contents are normal.The mastoid aircells and included paranasal  sinuses are well-aerated. OTHER: None. CTA NECK FINDINGS: AORTIC ARCH: Normal appearance of the thoracic arch, normal branch pattern. The origins of the innominate, left Common carotid artery and subclavian artery are widely patent. RIGHT CAROTID SYSTEM: Common carotid artery is patent. Normal appearance of the carotid bifurcation without hemodynamically significant stenosis by NASCET criteria. Normal appearance of the internal carotid artery. LEFT CAROTID SYSTEM: Common carotid artery is patent. Normal appearance of the carotid bifurcation without hemodynamically significant stenosis by NASCET criteria. Normal appearance of the internal carotid artery, tonsillar loop. VERTEBRAL ARTERIES:Left vertebral artery is dominant. Normal appearance of the vertebral arteries, widely patent. SKELETON: No acute osseous process though bone windows have not been submitted. OTHER NECK: Soft tissues of the neck are nonacute though, not tailored for evaluation. UPPER CHEST: Included lung apices are clear. No superior mediastinal lymphadenopathy. CTA HEAD FINDINGS: ANTERIOR CIRCULATION: Patent cervical internal carotid arteries, petrous, cavernous and supra clinoid internal carotid arteries. Patent anterior communicating artery. Patent anterior and middle cerebral arteries. No large vessel occlusion, significant stenosis, contrast extravasation or aneurysm. POSTERIOR CIRCULATION: Patent vertebral arteries, vertebrobasilar junction and basilar artery, as well as main branch vessels. Patent posterior cerebral arteries. Small LEFT posterior communicating artery present. No large vessel occlusion, significant stenosis, contrast extravasation or aneurysm. VENOUS SINUSES: Major dural venous sinuses are patent though not tailored for evaluation on this angiographic examination. ANATOMIC VARIANTS: Supernumerary anterior cerebral artery RIGHT A1 2 junction. DELAYED PHASE: No suspicious intracranial enhancement. Linear enhancement LEFT  cerebellum suggestive of developmental venous anomaly. MIP images reviewed. IMPRESSION: 1. Negative CT HEAD with and without contrast. 2. Negative CTA HEAD and CTA NECK. Electronically Signed   By: Awilda Metro M.D.   On: 03/14/2018 22:43

## 2018-03-14 NOTE — ED Notes (Signed)
Report from nicole, rn.  

## 2018-03-14 NOTE — ED Triage Notes (Signed)
States sudden headache started at 1630 today. States history of migraines and TIAs and brain anurysm which has not been repaired. Took Fioricet at [phone] and rested. Woke at 2130 L facial pain and pains L arm. Smile, grips and leg strength equal. Speech clear. Denies head injury, denies use of blood thinners.

## 2018-03-14 NOTE — ED Notes (Signed)
Pt talking on phone in no acute distress. Pt informed she will not be able to drive after medication administration. Pt states she will secure a ride home.

## 2018-03-14 NOTE — ED Provider Notes (Signed)
[facility] Emergency Department Provider Note  ____________________________________________   None    (approximate)  I have reviewed the triage vital signs and the nursing notes.   HISTORY  Chief Complaint Headache   HPI Teresa Whitaker is a 47 y.o. female who self presents to the emergency department with sudden onset severe sharp throbbing nonradiating headache behind her left eye that began at 4:30 PM acutely roughly 5 hours prior to arrival.  She reports a long-standing history of headaches and this feels similar.  She says that in [place] years ago she was diagnosed with a small "brain aneurysm" and she is concerned it could have ruptured.  She has some numbness in her left arm.  Some nausea.  Headache is worse with exposure to light.  Nothing seems to make it better.    Past Medical History:  Diagnosis Date  . Adrenal mass (HCC)   . Asthma   . Brain aneurysm   . TIA (transient ischemic attack)     Patient Active Problem List   Diagnosis Date Noted  . TIA (transient ischemic attack) 01/10/2018    Past Surgical History:  Procedure Laterality Date  . CARDIAC CATHETERIZATION    . CESAREAN SECTION    . CHOLECYSTECTOMY      Prior to Admission medications   Medication Sig Start Date End Date Taking? Authorizing Provider  acetaminophen (TYLENOL) 650 MG CR tablet Take 650 mg by mouth every 8 (eight) hours as needed for pain.    [provider]  albuterol (PROVENTIL HFA;VENTOLIN HFA) 108 (90 Base) MCG/ACT inhaler Inhale 2 puffs into the lungs 4 (four) times daily.    [provider]  amLODipine (NORVASC) 10 MG tablet Take 1 tablet (10 mg total) by mouth daily. 01/11/18 01/11/19  Katha Hamming, MD  aspirin EC 81 MG tablet Take 81-162 mg by mouth daily.    [provider]  Butalbital-APAP-Caffeine (FIORICET) 50-300-40 MG CAPS Take 1 capsule by mouth every 6 (six) hours as needed (headache (max 3 per episode/10 per week)).  01/11/18   Katha Hamming, MD  diclofenac (VOLTAREN) 75 MG EC tablet Take 75 mg by mouth 2 (two) times daily.    [provider]  docusate sodium (COLACE) 100 MG capsule Take 1 capsule (100 mg total) by mouth 2 (two) times daily as needed for mild constipation. 01/11/18   Salary, Evelena Asa, MD  omeprazole (PRILOSEC) 20 MG capsule Take 1 capsule (20 mg total) by mouth 2 (two) times daily. 01/30/18 01/30/19  Loleta Rose, MD  ondansetron (ZOFRAN ODT) 4 MG disintegrating tablet Allow 1-2 tablets to dissolve in your mouth every 8 hours as needed for nausea/vomiting 01/30/18   Loleta Rose, MD  predniSONE (DELTASONE) 20 MG tablet Take 2 tablets (40 mg total) by mouth daily. 02/07/18   Minna Antis, MD  ranitidine (ZANTAC) 150 MG tablet Take 150 mg by mouth 2 (two) times daily.    [provider]  sucralfate (CARAFATE) 1 g tablet Take 1 tablet (1 g total) by mouth 4 (four) times daily as needed (for abdominal discomfort, nausea, and/or vomiting). 01/30/18   Loleta Rose, MD  SUMAtriptan (IMITREX) 50 MG tablet Take 1 tablet (50 mg total) by mouth once as needed for migraine. May repeat in 2 hours if headache persists or recurs. 03/14/18 03/15/19  Merrily Brittle, MD  traMADol (ULTRAM) 50 MG tablet Take 50 mg by mouth every 4 (four) hours as needed for moderate pain.    [provider]  Allergies Amoxicillin and Bactrim [sulfamethoxazole-trimethoprim]  No family history on file.  Social History Social History   Tobacco Use  . Smoking status: Never Smoker  . Smokeless tobacco: Never Used  Substance Use Topics  . Alcohol use: Not Currently  . Drug use: Not Currently    Review of Systems Constitutional: No fever/chills Eyes: No visual changes. ENT: No sore throat. Cardiovascular: Denies chest pain. Respiratory: Denies shortness of breath. Gastrointestinal: No abdominal pain.  Positive for nausea, no vomiting.  No diarrhea.  No constipation. Genitourinary:  Negative for dysuria. Musculoskeletal: Negative for back pain. Skin: Negative for rash. Neurological: Positive for headache   ____________________________________________   PHYSICAL EXAM:  VITAL SIGNS: ED Triage Vitals  Enc Vitals Group     BP 03/14/18 2152 108/71     Pulse Rate 03/14/18 2152 82     Resp 03/14/18 2152 18     Temp 03/14/18 2152 98 F (36.7 C)     Temp Source 03/14/18 2152 Oral     SpO2 03/14/18 2152 97 %     Weight 03/14/18 2153 206 lb (93.4 kg)     Height 03/14/18 2153 5\' 4"  (1.626 m)     Head Circumference --      Peak Flow --      Pain Score 03/14/18 2152 10     Pain Loc --      Pain Edu? --      Excl. in GC? --     Constitutional: Alert and oriented x4 appears miserable sitting holding her head in a darkened room Eyes: PERRL EOMI. midrange and brisk.  No injection.  With eyes closed they do not feel full Head: Atraumatic. Nose: No congestion/rhinnorhea. Mouth/Throat: No trismus Neck: No stridor.  No meningismus Cardiovascular: Normal rate, regular rhythm. Grossly normal heart sounds.  Good peripheral circulation. Respiratory: Normal respiratory effort.  No retractions. Lungs CTAB and moving good air Gastrointestinal: Soft nontender Musculoskeletal: No lower extremity edema   Neurologic:  Normal speech and language. No gross focal neurologic deficits are appreciated. Skin:  Skin is warm, dry and intact. No rash noted. Psychiatric: Mood and affect are normal. Speech and behavior are normal.    ____________________________________________   DIFFERENTIAL includes but not limited to  Intracerebral hemorrhage, subarachnoid hemorrhage, cervical artery dissection, meningitis, encephalitis ____________________________________________   LABS (all labs ordered are listed, but only abnormal results are displayed)  Labs Reviewed  COMPREHENSIVE METABOLIC PANEL - Abnormal; Notable for the following components:      Result Value   Glucose, Bld 103 (*)      Calcium 8.3 (*)    Total Protein 5.6 (*)    Albumin 3.3 (*)    Alkaline Phosphatase 35 (*)    All other components within normal limits  PROTIME-INR  APTT  CBC  DIFFERENTIAL  TROPONIN I  CBG MONITORING, ED  POC URINE PREG, ED    Lab work reviewed by me with no acute disease __________________________________________  EKG  ED ECG REPORT I, Merrily Brittle, the attending physician, personally viewed and interpreted this ECG.  Date: 03/15/2018 EKG Time:  Rate: 72 Rhythm: normal sinus rhythm QRS Axis: normal Intervals: normal ST/T Wave abnormalities: normal Narrative Interpretation: no evidence of acute ischemia  ____________________________________________  RADIOLOGY  CT angiogram the head neck reviewed by me with no acute disease ____________________________________________   PROCEDURES  Procedure(s) performed: no  Procedures  Critical Care performed: no  ____________________________________________   INITIAL IMPRESSION / ASSESSMENT AND PLAN / ED COURSE  Pertinent labs &  imaging results that were available during my care of the patient were reviewed by me and considered in my medical decision making (see chart for details).   As part of my medical decision making, I reviewed the following data within the electronic MEDICAL RECORD NUMBER History obtained from family if available, nursing notes, old chart and ekg, as well as notes from prior ED visits.  The patient arrives with a thunderclap headache 5 hours after onset.  She does have a self-reported history of cerebral aneurysm so we progressed directly to CT angiogram of the head and neck.  CT is fortunately negative for acute pathology and interestingly does not show any aneurysm although these are known to have false negatives.  Given Compazine and Benadryl with minimal relief.  As her headache persists and she is not bleeding we will give her 15 mg of Toradol and 1 mg of dihydroergotamine and  reevaluate.  ----------------------------------------- 12:17 AM on 03/15/2018 -----------------------------------------  The patient's headache is nearly completely resolved.  At this point she is medically stable for outpatient management verbalizes understanding and agreement with the plan.      ____________________________________________   FINAL CLINICAL IMPRESSION(S) / ED DIAGNOSES  Final diagnoses:  Nonintractable headache, unspecified chronicity pattern, unspecified headache type      NEW MEDICATIONS STARTED DURING THIS VISIT:  Current Discharge Medication List    START taking these medications   Details  SUMAtriptan (IMITREX) 50 MG tablet Take 1 tablet (50 mg total) by mouth once as needed for migraine. May repeat in 2 hours if headache persists or recurs. Qty: 30 tablet, Refills: 0         Note:  This document was prepared using Dragon voice recognition software and may include unintentional dictation errors.     Merrily Brittleifenbark, Larrissa Stivers, MD 03/15/18 [phone]

## 2018-03-15 NOTE — ED Notes (Signed)
Pt was discharged by rachel, rn at [phone]

## 2018-03-15 NOTE — ED Notes (Signed)
Pt wheeled out to waiting car with female driver.

## 2018-04-17 ENCOUNTER — Encounter: Payer: Self-pay | Admitting: Adult Health

## 2018-04-17 ENCOUNTER — Ambulatory Visit: Payer: Medicaid Other | Admitting: Adult Health

## 2018-04-17 VITALS — BP 97/64 | HR 76 | Temp 98.1°F | Ht 61.0 in | Wt 207.1 lb

## 2018-04-17 DIAGNOSIS — I10 Essential (primary) hypertension: Secondary | ICD-10-CM

## 2018-04-17 DIAGNOSIS — N342 Other urethritis: Secondary | ICD-10-CM

## 2018-04-17 DIAGNOSIS — M5442 Lumbago with sciatica, left side: Secondary | ICD-10-CM

## 2018-04-17 DIAGNOSIS — Z0000 Encounter for general adult medical examination without abnormal findings: Secondary | ICD-10-CM

## 2018-04-17 DIAGNOSIS — M5441 Lumbago with sciatica, right side: Secondary | ICD-10-CM

## 2018-04-17 DIAGNOSIS — Z6839 Body mass index (BMI) 39.0-39.9, adult: Secondary | ICD-10-CM

## 2018-04-17 DIAGNOSIS — J4520 Mild intermittent asthma, uncomplicated: Secondary | ICD-10-CM

## 2018-04-17 DIAGNOSIS — G8929 Other chronic pain: Secondary | ICD-10-CM

## 2018-04-17 DIAGNOSIS — K219 Gastro-esophageal reflux disease without esophagitis: Secondary | ICD-10-CM

## 2018-04-17 MED ORDER — ALBUTEROL SULFATE HFA 108 (90 BASE) MCG/ACT IN AERS: 2.0000 | INHALATION_SPRAY | Freq: Four times a day (QID) | RESPIRATORY_TRACT | 0 refills | Status: AC

## 2018-04-17 MED ORDER — DICLOFENAC SODIUM 75 MG PO TBEC: 75.0000 mg | DELAYED_RELEASE_TABLET | Freq: Two times a day (BID) | ORAL | 2 refills | Status: AC | PRN

## 2018-04-17 MED ORDER — OMEPRAZOLE 20 MG PO CPDR: 20.0000 mg | DELAYED_RELEASE_CAPSULE | Freq: Two times a day (BID) | ORAL | 1 refills | Status: AC

## 2018-04-17 MED ORDER — BACITRACIN 500 UNIT/GM EX OINT
1.0000 | TOPICAL_OINTMENT | Freq: Two times a day (BID) | CUTANEOUS | 0 refills | Status: AC
Start: 2018-04-17 — End: ?

## 2018-04-17 MED ORDER — ASPIRIN EC 81 MG PO TBEC: 81.0000 mg | DELAYED_RELEASE_TABLET | Freq: Every day | ORAL | 1 refills | Status: AC

## 2018-04-17 MED ORDER — RANITIDINE HCL 150 MG PO TABS: 150.0000 mg | ORAL_TABLET | Freq: Two times a day (BID) | ORAL | 2 refills | Status: AC

## 2018-04-17 MED ORDER — ACETAMINOPHEN ER 650 MG PO TBCR: 650.0000 mg | EXTENDED_RELEASE_TABLET | Freq: Three times a day (TID) | ORAL | 1 refills | Status: AC | PRN

## 2018-04-17 MED ORDER — DOXYCYCLINE HYCLATE 100 MG PO TABS
100.0000 mg | ORAL_TABLET | Freq: Two times a day (BID) | ORAL | 0 refills | Status: AC
Start: 2018-04-17 — End: 2018-04-24

## 2018-04-17 MED ORDER — DOXYCYCLINE HYCLATE 100 MG PO TABS: 100.0000 mg | ORAL_TABLET | Freq: Two times a day (BID) | ORAL | 0 refills | Status: DC

## 2018-04-17 MED ORDER — HYDROCORTISONE 1 % EX OINT: 1.0000 "application " | TOPICAL_OINTMENT | Freq: Two times a day (BID) | CUTANEOUS | 0 refills | Status: AC

## 2018-04-17 MED ORDER — PREDNISONE 10 MG PO TABS: ORAL_TABLET | ORAL | 0 refills | Status: AC

## 2018-04-17 MED ORDER — PREDNISONE 10 MG PO TABS: ORAL_TABLET | ORAL | 0 refills | Status: DC

## 2018-04-17 NOTE — Patient Instructions (Addendum)
Fat and Cholesterol Restricted Diet Getting too much fat and cholesterol in your diet may cause health problems. Following this diet helps keep your fat and cholesterol at normal levels. This can keep you from getting sick. What types of fat should I choose?  Choose monosaturated and polyunsaturated fats. These are found in foods such as olive oil, canola oil, flaxseeds, walnuts, almonds, and seeds.  Eat more omega-3 fats. Good choices include salmon, mackerel, sardines, tuna, flaxseed oil, and ground flaxseeds.  Limit saturated fats. These are in animal products such as meats, butter, and cream. They can also be in plant products such as palm oil, palm kernel oil, and coconut oil.  Avoid foods with partially hydrogenated oils in them. These contain trans fats. Examples of foods that have trans fats are stick margarine, some tub margarines, cookies, crackers, and other baked goods. What general guidelines do I need to follow?  Check food labels. Look for the words "trans fat" and "saturated fat."  When preparing a meal: ? Fill half of your plate with vegetables and green salads. ? Fill one fourth of your plate with whole grains. Look for the word "whole" as the first word in the ingredient list. ? Fill one fourth of your plate with lean protein foods.  Eat more foods that have fiber, like apples, carrots, beans, peas, and barley.  Eat more home-cooked foods. Eat less at restaurants and buffets.  Limit or avoid alcohol.  Limit foods high in starch and sugar.  Limit fried foods.  Cook foods without frying them. Baking, boiling, grilling, and broiling are all great options.  Lose weight if you are overweight. Losing even a small amount of weight can help your overall health. It can also help prevent diseases such as diabetes and heart disease. What foods can I eat? Grains Whole grains, such as whole wheat or whole grain breads, crackers, cereals, and pasta. Unsweetened oatmeal,  bulgur, barley, quinoa, or brown rice. Corn or whole wheat flour tortillas. Vegetables Fresh or frozen vegetables (raw, steamed, roasted, or grilled). Green salads. Fruits All fresh, canned (in natural juice), or frozen fruits. Meat and Other Protein Products Ground beef (85% or leaner), grass-fed beef, or beef trimmed of fat. Skinless chicken or [place]. Ground chicken or [place]. Pork trimmed of fat. All fish and seafood. Eggs. Dried beans, peas, or lentils. Unsalted nuts or seeds. Unsalted canned or dry beans. Dairy Low-fat dairy products, such as skim or 1% milk, 2% or reduced-fat cheeses, low-fat ricotta or cottage cheese, or plain low-fat yogurt. Fats and Oils Tub margarines without trans fats. Light or reduced-fat mayonnaise and salad dressings. Avocado. Olive, canola, sesame, or safflower oils. Natural peanut or almond butter (choose ones without added sugar and oil). The items listed above may not be a complete list of recommended foods or beverages. Contact your dietitian for more options. What foods are not recommended? Grains White bread. White pasta. White rice. Cornbread. Bagels, pastries, and croissants. Crackers that contain trans fat. Vegetables White potatoes. Corn. Creamed or fried vegetables. Vegetables in a cheese sauce. Fruits Dried fruits. Canned fruit in light or heavy syrup. Fruit juice. Meat and Other Protein Products Fatty cuts of meat. Ribs, chicken wings, bacon, sausage, bologna, salami, chitterlings, fatback, hot dogs, bratwurst, and packaged luncheon meats. Liver and organ meats. Dairy Whole or 2% milk, cream, half-and-half, and cream cheese. Whole milk cheeses. Whole-fat or sweetened yogurt. Full-fat cheeses. Nondairy creamers and whipped toppings. Processed cheese, cheese spreads, or cheese curds. Sweets and Desserts  Corn syrup, sugars, honey, and molasses. Candy. Jam and jelly. Syrup. Sweetened cereals. Cookies, pies, cakes, donuts, muffins, and ice  cream. Fats and Oils Butter, stick margarine, lard, shortening, ghee, or bacon fat. Coconut, palm kernel, or palm oils. Beverages Alcohol. Sweetened drinks (such as sodas, lemonade, and fruit drinks or punches). The items listed above may not be a complete list of foods and beverages to avoid. Contact your dietitian for more information. This information is not intended to replace advice given to you by Back Exercises If you have pain in your back, do these exercises 2-3 times each day or as told by your doctor. When the pain goes away, do the exercises once each day, but repeat the steps more times for each exercise (do more repetitions). If you do not have pain in your back, do these exercises once each day or as told by your doctor. Exercises Single Knee to Chest  Do these steps 3-5 times in a row for each leg: 1. Lie on your back on a firm bed or the floor with your legs stretched out. 2. Bring one knee to your chest. 3. Hold your knee to your chest by grabbing your knee or thigh. 4. Pull on your knee until you feel a gentle stretch in your lower back. 5. Keep doing the stretch for 10-30 seconds. 6. Slowly let go of your leg and straighten it.  Pelvic Tilt  Do these steps 5-10 times in a row: 1. Lie on your back on a firm bed or the floor with your legs stretched out. 2. Bend your knees so they point up to the ceiling. Your feet should be flat on the floor. 3. Tighten your lower belly (abdomen) muscles to press your lower back against the floor. This will make your tailbone point up to the ceiling instead of pointing down to your feet or the floor. 4. Stay in this position for 5-10 seconds while you gently tighten your muscles and breathe evenly.  Cat-Cow  Do these steps until your lower back bends more easily: 1. Get on your hands and knees on a firm surface. Keep your hands under your shoulders, and keep your knees under your hips. You may put padding under your knees. 2. Let  your head hang down, and make your tailbone point down to the floor so your lower back is round like the back of a cat. 3. Stay in this position for 5 seconds. 4. Slowly lift your head and make your tailbone point up to the ceiling so your back hangs low (sags) like the back of a cow. 5. Stay in this position for 5 seconds.  Press-Ups  Do these steps 5-10 times in a row: 1. Lie on your belly (face-down) on the floor. 2. Place your hands near your head, about shoulder-width apart. 3. While you keep your back relaxed and keep your hips on the floor, slowly straighten your arms to raise the top half of your body and lift your shoulders. Do not use your back muscles. To make yourself more comfortable, you may change where you place your hands. 4. Stay in this position for 5 seconds. 5. Slowly return to lying flat on the floor.  Bridges  Do these steps 10 times in a row: 1. Lie on your back on a firm surface. 2. Bend your knees so they point up to the ceiling. Your feet should be flat on the floor. 3. Tighten your butt muscles and lift your butt off of  the floor until your waist is almost as high as your knees. If you do not feel the muscles working in your butt and the back of your thighs, slide your feet 1-2 inches farther away from your butt. 4. Stay in this position for 3-5 seconds. 5. Slowly lower your butt to the floor, and let your butt muscles relax.  If this exercise is too easy, try doing it with your arms crossed over your chest. Belly Crunches  Do these steps 5-10 times in a row: 1. Lie on your back on a firm bed or the floor with your legs stretched out. 2. Bend your knees so they point up to the ceiling. Your feet should be flat on the floor. 3. Cross your arms over your chest. 4. Tip your chin a little bit toward your chest but do not bend your neck. 5. Tighten your belly muscles and slowly raise your chest just enough to lift your shoulder blades a tiny bit off of the  floor. 6. Slowly lower your chest and your head to the floor.  Back Lifts Do these steps 5-10 times in a row: 1. Lie on your belly (face-down) with your arms at your sides, and rest your forehead on the floor. 2. Tighten the muscles in your legs and your butt. 3. Slowly lift your chest off of the floor while you keep your hips on the floor. Keep the back of your head in line with the curve in your back. Look at the floor while you do this. 4. Stay in this position for 3-5 seconds. 5. Slowly lower your chest and your face to the floor.  Contact a doctor if:  Your back pain gets a lot worse when you do an exercise.  Your back pain does not lessen 2 hours after you exercise. If you have any of these problems, stop doing the exercises. Do not do them again unless your doctor says it is okay. Get help right away if:  You have sudden, very bad back pain. If this happens, stop doing the exercises. Do not do them again unless your doctor says it is okay. This information is not intended to replace advice given to you by your health care provider. Make sure you discuss any questions you have with your health care provider. Document Released: 09/08/2010 Document Revised: 01/12/2016 Document Reviewed: 09/30/2014 Elsevier Interactive Patient Education  2018 [employer]  Sciatica Sciatica is pain, numbness, weakness, or tingling along your sciatic nerve. The sciatic nerve starts in the lower back and goes down the back of each leg. Sciatica happens when this nerve is pinched or has pressure put on it. Sciatica usually goes away on its own or with treatment. Sometimes, sciatica may keep coming back (recur). Follow these instructions at home: Medicines  Take over-the-counter and prescription medicines only as told by your doctor.  Do not drive or use heavy machinery while taking prescription pain medicine. Managing pain  If directed, put ice on the affected area. ? Put ice in a plastic  bag. ? Place a towel between your skin and the bag. ? Leave the ice on for 20 minutes, 2-3 times a day.  After icing, apply heat to the affected area before you exercise or as often as told by your doctor. Use the heat source that your doctor tells you to use, such as a moist heat pack or a heating pad. ? Place a towel between your skin and the heat source. ? Leave the heat  on for 20-30 minutes. ? Remove the heat if your skin turns bright red. This is especially important if you are unable to feel pain, heat, or cold. You may have a greater risk of getting burned. Activity  Return to your normal activities as told by your doctor. Ask your doctor what activities are safe for you. ? Avoid activities that make your sciatica worse.  Take short rests during the day. Rest in a lying or standing position. This is usually better than sitting to rest. ? When you rest for a long time, do some physical activity or stretching between periods of rest. ? Avoid sitting for a long time without moving. Get up and move around at least one time each hour.  Exercise and stretch regularly, as told by your doctor.  Do not lift anything that is heavier than 10 lb (4.5 kg) while you have symptoms of sciatica. ? Avoid lifting heavy things even when you do not have symptoms. ? Avoid lifting heavy things over and over.  When you lift objects, always lift in a way that is safe for your body. To do this, you should: ? Bend your knees. ? Keep the object close to your body. ? Avoid twisting. General instructions  Use good posture. ? Avoid leaning forward when you are sitting. ? Avoid hunching over when you are standing.  Stay at a healthy weight.  Wear comfortable shoes that support your feet. Avoid wearing high heels.  Avoid sleeping on a mattress that is too soft or too hard. You might have less pain if you sleep on a mattress that is firm enough to support your back.  Keep all follow-up visits as told by  your doctor. This is important. Contact a doctor if:  You have pain that: ? Wakes you up when you are sleeping. ? Gets worse when you lie down. ? Is worse than the pain you have had in the past. ? Lasts longer than 4 weeks.  You lose weight for without trying. Get help right away if:  You cannot control when you pee (urinate) or poop (have a bowel movement).  You have weakness in any of these areas and it gets worse. ? Lower back. ? Lower belly (pelvis). ? Butt (buttocks). ? Legs.  You have redness or swelling of your back.  You have a burning feeling when you pee. This information is not intended to replace advice given to you by your health care provider. Make sure you discuss any questions you have with your health care provider. Document Released: 05/15/2008 Document Revised: 01/12/2016 Document Reviewed: 04/15/2015 Elsevier Interactive Patient Education  2018 [employer]  Back Pain, Adult Back pain is very common. The pain often gets better over time. The cause of back pain is usually not dangerous. Most people can learn to manage their back pain on their own. Follow these instructions at home: Watch your back pain for any changes. The following actions may help to lessen any pain you are feeling:  Stay active. Start with short walks on flat ground if you can. Try to walk farther each day.  Exercise regularly as told by your doctor. Exercise helps your back heal faster. It also helps avoid future injury by keeping your muscles strong and flexible.  Do not sit, drive, or stand in one place for more than 30 minutes.  Do not stay in bed. Resting more than 1-2 days can slow down your recovery.  Be careful when you bend or lift  an object. Use good form when lifting: ? Bend at your knees. ? Keep the object close to your body. ? Do not twist.  Sleep on a firm mattress. Lie on your side, and bend your knees. If you lie on your back, put a pillow under your knees.  Take  medicines only as told by your doctor.  Put ice on the injured area. ? Put ice in a plastic bag. ? Place a towel between your skin and the bag. ? Leave the ice on for 20 minutes, 2-3 times a day for the first 2-3 days. After that, you can switch between ice and heat packs.  Avoid feeling anxious or stressed. Find good ways to deal with stress, such as exercise.  Maintain a healthy weight. Extra weight puts stress on your back.  Contact a doctor if:  You have pain that does not go away with rest or medicine.  You have worsening pain that goes down into your legs or buttocks.  You have pain that does not get better in one week.  You have pain at night.  You lose weight.  You have a fever or chills. Get help right away if:  You cannot control when you poop (bowel movement) or pee (urinate).  Your arms or legs feel weak.  Your arms or legs lose feeling (numbness).  You feel sick to your stomach (nauseous) or throw up (vomit).  You have belly (abdominal) pain.  You feel like you may pass out (faint). This information is not intended to replace advice given to you by your health care provider. Make sure you discuss any questions you have with your health care provider. Document Released: 01/23/2008 Document Revised: 01/12/2016 Document Reviewed: 12/08/2013 Elsevier Interactive Patient Education  2018 [employer].  your health care provider. Make sure you discuss any questions you have with your health care provider. Document Released: 02/05/2012 Document Revised: 04/12/2016 Document Reviewed: 11/05/2013 Elsevier Interactive Patient Education  [employer].

## 2018-04-17 NOTE — Progress Notes (Signed)
Patient ID: Teresa Whitaker, female   DOB: [birth year], 47 y.o.   MRN: [id number]  Chief Complaint  Patient presents with  . Back Pain    has pain from both sciatic nerve areas from 'shoulders to my feet'. Also c/o today of ? vaginal tear from previous sexual intercourse.    HPI Teresa Whitaker is a 46 y.o. female who presents for an initial office visit and for evaluation of back pain and ?vaginal tear. She reports having unprotected intercourse a few days ago and states that she started having pain around her vagina that has progressively gotten worse.  She also noted multiple excoriated areas around the labia.  She denies hematuria, pelvic pain, vaginal discharge but reports pain with urination.  She she tried over-the-counter remedies without any significant relief.  She denies any fever nausea vomiting and abdominal pain.  She has a history of chronic sciatica as well as bilateral shoulder pain.  She reports persistent pain but no interval worsening.  She took a prednisone taper before with significant improvement.  She reports a previous history of TIA with a negative work-up.  She reports a history of mild intermittent asthma that is well controlled with as needed bronchodilators. She is uncertain if she is up-to-date on her immunizations, and mammogram  Past Medical History:  Diagnosis Date  . Adrenal mass ([place])   . Asthma   . Brain aneurysm   . TIA (transient ischemic attack)     Past Surgical History:  Procedure Laterality Date  . CARDIAC CATHETERIZATION    . CESAREAN SECTION    . CHOLECYSTECTOMY      No family history on file.  Social History Social History   Tobacco Use  . Smoking status: Never Smoker  . Smokeless tobacco: Never Used  Substance Use Topics  . Alcohol use: Not Currently  . Drug use: Not Currently    Allergies  Allergen Reactions  . Amlodipine Besylate Other (See Comments)    Visual disturbances (blurriness)  . Amoxicillin Hives  . Bactrim  [Sulfamethoxazole-Trimethoprim] Hives    Current Outpatient Medications  Medication Sig Dispense Refill  . acetaminophen (TYLENOL) 650 MG CR tablet Take 650 mg by mouth every 8 (eight) hours as needed for pain.    Marland Kitchen albuterol (PROVENTIL HFA;VENTOLIN HFA) 108 (90 Base) MCG/ACT inhaler Inhale 2 puffs into the lungs 4 (four) times daily.    Marland Kitchen aspirin EC 81 MG tablet Take 81-162 mg by mouth daily.    . Butalbital-APAP-Caffeine (FIORICET) 50-300-40 MG CAPS Take 1 capsule by mouth every 6 (six) hours as needed (headache (max 3 per episode/10 per week)). 20 capsule 0  . diclofenac (VOLTAREN) 75 MG EC tablet Take 75 mg by mouth 2 (two) times daily.    . ranitidine (ZANTAC) 150 MG tablet Take 150 mg by mouth 2 (two) times daily.    . traMADol (ULTRAM) 50 MG tablet Take 50 mg by mouth every 4 (four) hours as needed for moderate pain.    Marland Kitchen amLODipine (NORVASC) 10 MG tablet Take 1 tablet (10 mg total) by mouth daily. (Patient not taking: Reported on 04/17/2018) 30 tablet 11  . docusate sodium (COLACE) 100 MG capsule Take 1 capsule (100 mg total) by mouth 2 (two) times daily as needed for mild constipation. (Patient not taking: Reported on 04/17/2018) 30 capsule 0  . omeprazole (PRILOSEC) 20 MG capsule Take 1 capsule (20 mg total) by mouth 2 (two) times daily. (Patient not taking: Reported on 04/17/2018) 60 capsule 1  .  ondansetron (ZOFRAN ODT) 4 MG disintegrating tablet Allow 1-2 tablets to dissolve in your mouth every 8 hours as needed for nausea/vomiting (Patient not taking: Reported on 04/17/2018) 30 tablet 0  . predniSONE (DELTASONE) 20 MG tablet Take 2 tablets (40 mg total) by mouth daily. (Patient not taking: Reported on 04/17/2018) 10 tablet 0  . sucralfate (CARAFATE) 1 g tablet Take 1 tablet (1 g total) by mouth 4 (four) times daily as needed (for abdominal discomfort, nausea, and/or vomiting). (Patient not taking: Reported on 04/17/2018) 30 tablet 1  . SUMAtriptan (IMITREX) 50 MG tablet Take 1 tablet (50 mg  total) by mouth once as needed for migraine. May repeat in 2 hours if headache persists or recurs. (Patient not taking: Reported on 04/17/2018) 30 tablet 0   No current facility-administered medications for this visit.     Review of Systems Review of Systems  Constitutional: Negative.   HENT: Negative.   Eyes: Negative.   Respiratory: Negative.   Cardiovascular: Negative.   Gastrointestinal: Negative.   Endocrine: Negative.   Genitourinary: Positive for dysuria and vaginal pain.  Musculoskeletal:       Bilateral shoulder pain and bilateral hip pain that radiates down both legs  Skin: Negative.   Allergic/Immunologic: Negative.   Neurological: Negative.   Hematological: Negative.   Psychiatric/Behavioral: Negative.    Blood pressure 97/64, pulse 76, temperature 98.1 F (36.7 C), temperature source Oral, height _0  (1.549 m), weight 207 lb 1.6 oz (93.9 kg).  Physical Exam Physical Exam  Constitutional: She is oriented to person, place, and time. She appears well-developed and well-nourished.  HENT:  Head: Normocephalic and atraumatic.  Nose: Nose normal.  Mouth/Throat: Oropharynx is clear and moist.  Eyes: Pupils are equal, round, and reactive to light. Conjunctivae and EOM are normal.  Neck: Normal range of motion. Neck supple.  Cardiovascular: Normal rate, regular rhythm, normal heart sounds and intact distal pulses.  Pulmonary/Chest: Effort normal and breath sounds normal.  Abdominal: Soft. Bowel sounds are normal.  Genitourinary: Vaginal discharge found.  Genitourinary Comments: Patient declined vaginal exam but showed me a picture of an excoriated vagina with erythema, no discharge noted  Musculoskeletal: Normal range of motion. She exhibits tenderness. She exhibits no edema or deformity.  Neurological: She is alert and oriented to person, place, and time.  Skin: Skin is warm and dry. Capillary refill takes less than 2 seconds.   Data Reviewed Routine labs  ordered  Assessment and Plan 1. Body mass index (BMI) of 39.0-39.9 in adult Weight loss advice given.  Will screen for routine cardiovascular risk factors - Hemoglobin A1c - Lipid panel - 2. Essential hypertension She is not currently on any medications.  Patient advised to monitor her blood pressure and bring logs during the next visit Aspirin 81 mg daily  3. Chronic bilateral low back pain with bilateral sciatica Will put on a steroid taper and referred to Ortho for further evaluation.  Patient advised to stop Voltaren 75 mg daily while taking the steroid.  She is also advised to take Tylenol as needed.  Back exercises reviewed  4. Urethritis Doxycycline 100 mg twice a day and bacitracin ointment topically twice a day Safe sex practices reviewed - CBC w/Diff - Comp Met (CMET) - Urinalysis, Routine w reflex microscopic  5. Gastroesophageal reflux disease without esophagitis Continue omeprazole 20 mg twice a day  6. Mild intermittent asthma without complication Controlled.  Continue current medications - albuterol (PROVENTIL HFA;VENTOLIN HFA) 108 (90 Base) MCG/ACT inhaler; Inhale 2  puffs into the lungs 4 (four) times daily.  Dispense: 1 Inhaler; Refill: 0  7. Health maintenance examination Unremarkable physical exam.  Lifestyle modification advice given.  Will obtain routine labs - Hemoglobin A1c - Comprehensive metabolic panel  Magddalene S Tukov-Yual 04/17/2018, 9:36 AM

## 2018-04-18 LAB — TSH: TSH: 4.33 u[IU]/mL (ref 0.450–4.500)

## 2018-04-18 LAB — CBC WITH DIFFERENTIAL/PLATELET
BASOS ABS: 0 10*3/uL (ref 0.0–0.2)
Basos: 0 %
EOS (ABSOLUTE): 0.2 10*3/uL (ref 0.0–0.4)
EOS: 2 %
HEMATOCRIT: 40.5 % (ref 34.0–46.6)
Hemoglobin: 13.1 g/dL (ref 11.1–15.9)
IMMATURE GRANULOCYTES: 0 %
Immature Grans (Abs): 0 10*3/uL (ref 0.0–0.1)
Lymphocytes Absolute: 1.9 10*3/uL (ref 0.7–3.1)
Lymphs: 27 %
MCH: 28.6 pg (ref 26.6–33.0)
MCHC: 32.3 g/dL (ref 31.5–35.7)
MCV: 88 fL (ref 79–97)
MONOCYTES: 5 %
Monocytes Absolute: 0.4 10*3/uL (ref 0.1–0.9)
NEUTROS PCT: 66 %
Neutrophils Absolute: 4.7 10*3/uL (ref 1.4–7.0)
Platelets: 337 10*3/uL (ref 150–450)
RBC: 4.58 x10E6/uL (ref 3.77–5.28)
RDW: 12.8 % (ref 12.3–15.4)
WBC: 7.2 10*3/uL (ref 3.4–10.8)

## 2018-04-18 LAB — LIPID PANEL
CHOLESTEROL TOTAL: 131 mg/dL (ref 100–199)
Chol/HDL Ratio: 4.7 ratio — ABNORMAL HIGH (ref 0.0–4.4)
HDL: 28 mg/dL — ABNORMAL LOW (ref >39)
LDL Calculated: 76 mg/dL (ref 0–99)
Triglycerides: 135 mg/dL (ref 0–149)
VLDL Cholesterol Cal: 27 mg/dL (ref 5–40)

## 2018-04-18 LAB — COMPREHENSIVE METABOLIC PANEL

## 2018-04-18 LAB — HEMOGLOBIN A1C

## 2018-04-19 LAB — COMPREHENSIVE METABOLIC PANEL
ALBUMIN: 3.7 g/dL (ref 3.5–5.5)
ALT: 13 IU/L (ref 0–32)
AST: 11 IU/L (ref 0–40)
Albumin/Globulin Ratio: 1.8 (ref 1.2–2.2)
Alkaline Phosphatase: 48 IU/L (ref 39–117)
BILIRUBIN TOTAL: 0.2 mg/dL (ref 0.0–1.2)
BUN/Creatinine Ratio: 20 (ref 9–23)
BUN: 15 mg/dL (ref 6–24)
CALCIUM: 9 mg/dL (ref 8.7–10.2)
CO2: 22 mmol/L (ref 20–29)
Chloride: 105 mmol/L (ref 96–106)
Creatinine, Ser: 0.76 mg/dL (ref 0.57–1.00)
GFR calc Af Amer: 110 mL/min/{1.73_m2} (ref >59)
GFR, EST NON AFRICAN AMERICAN: 95 mL/min/{1.73_m2} (ref >59)
GLOBULIN, TOTAL: 2.1 g/dL (ref 1.5–4.5)
Glucose: 119 mg/dL — ABNORMAL HIGH (ref 65–99)
POTASSIUM: 4.2 mmol/L (ref 3.5–5.2)
SODIUM: 141 mmol/L (ref 134–144)
TOTAL PROTEIN: 5.8 g/dL — AB (ref 6.0–8.5)

## 2018-04-19 LAB — HEMOGLOBIN A1C
Est. average glucose Bld gHb Est-mCnc: 117 mg/dL
Hgb A1c MFr Bld: 5.7 % — ABNORMAL HIGH (ref 4.8–5.6)

## 2018-04-19 LAB — SPECIMEN STATUS REPORT

## 2018-04-22 ENCOUNTER — Ambulatory Visit: Payer: Self-pay

## 2018-04-23 ENCOUNTER — Ambulatory Visit: Payer: Medicaid Other | Admitting: Specialist

## 2018-04-23 DIAGNOSIS — M5431 Sciatica, right side: Secondary | ICD-10-CM

## 2018-04-23 DIAGNOSIS — M5432 Sciatica, left side: Principal | ICD-10-CM
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
## 2018-04-25 ENCOUNTER — Ambulatory Visit: Payer: Medicaid Other | Admitting: Pharmacy Technician

## 2018-04-26 ENCOUNTER — Encounter: Payer: Self-pay | Admitting: Adult Health

## 2018-04-30 NOTE — Progress Notes (Signed)
Patient scheduled for eligibility appointment at [facility].  Patient did not show for the appointment on April 25, 2018 at 10:30a..m.  Patient did not reschedule eligibility appointment.  [facility] unable to provide additional medication assistance until eligibility is determined.  Sherilyn Dacosta Care Manager [facility]

## 2018-05-14 ENCOUNTER — Telehealth: Payer: Self-pay

## 2018-05-14 NOTE — Telephone Encounter (Signed)
Need to reschedule 05/15/18 appointment.  Provider called in sick. Left patient a voicemail.

## 2018-05-15 ENCOUNTER — Ambulatory Visit: Payer: Medicaid Other | Admitting: Adult Health

## 2018-05-22 ENCOUNTER — Ambulatory Visit: Payer: Medicaid Other | Admitting: Adult Health

## 2018-08-14 ENCOUNTER — Telehealth: Payer: Self-pay | Admitting: Pharmacy Technician

## 2018-08-14 NOTE — Telephone Encounter (Signed)
Patient needs to provide 2018 tax return and last 30 days of paystubs.  Have left numerous phone messages for patient.  Patient has not responded.  [facility] provide additional [facility] assistance until requested financial documentation has been provided.  Sherilyn DacostaBetty J. Emidio Warrell Care Manager [facility]

## 2018-10-17 IMAGING — CR DG CHEST 2V
2 series · 2 of 2 positions shown · non-contrast
Comparison: 01/10/2018

CLINICAL DATA: Chest pain.

EXAM:
CHEST - 2 VIEW

[chest pa]
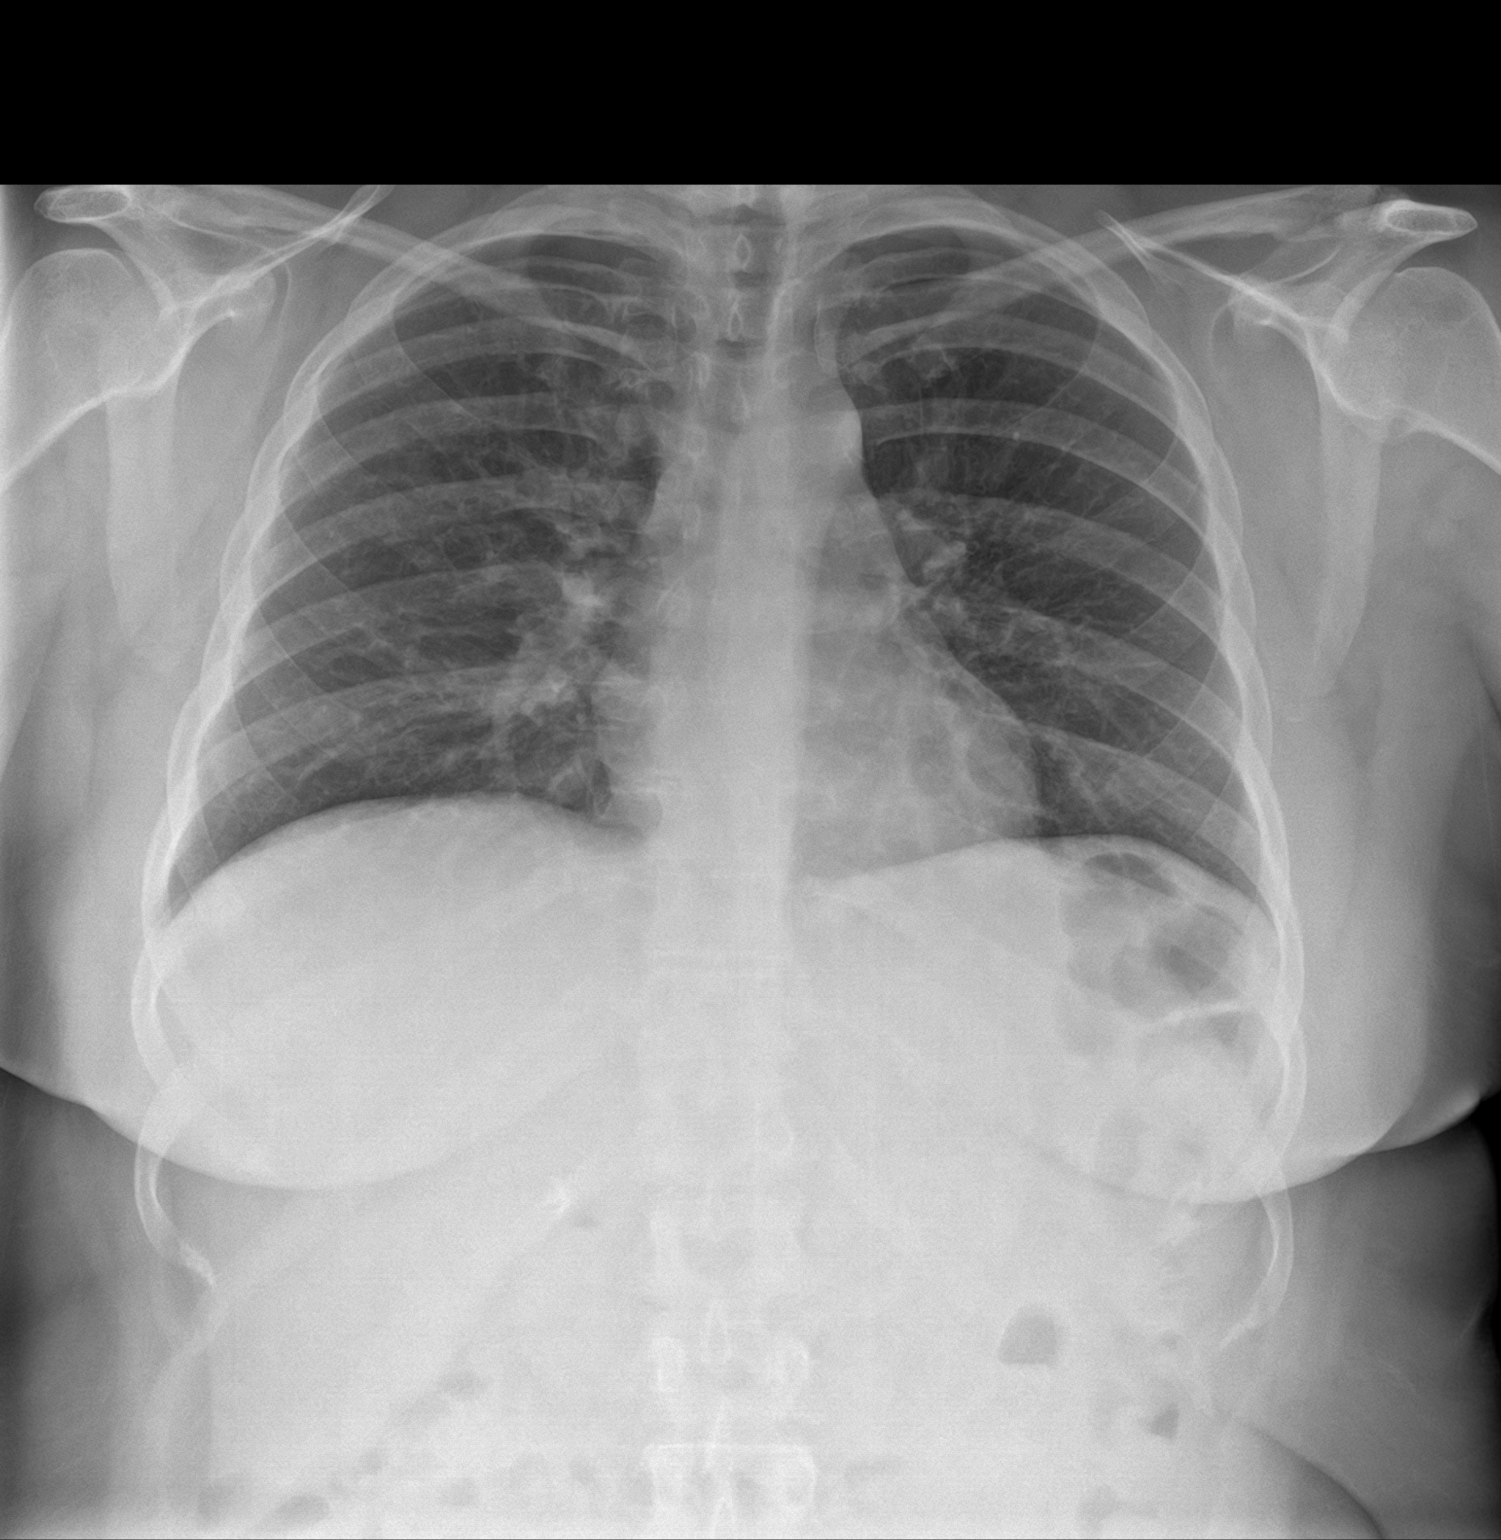

[chest lat]
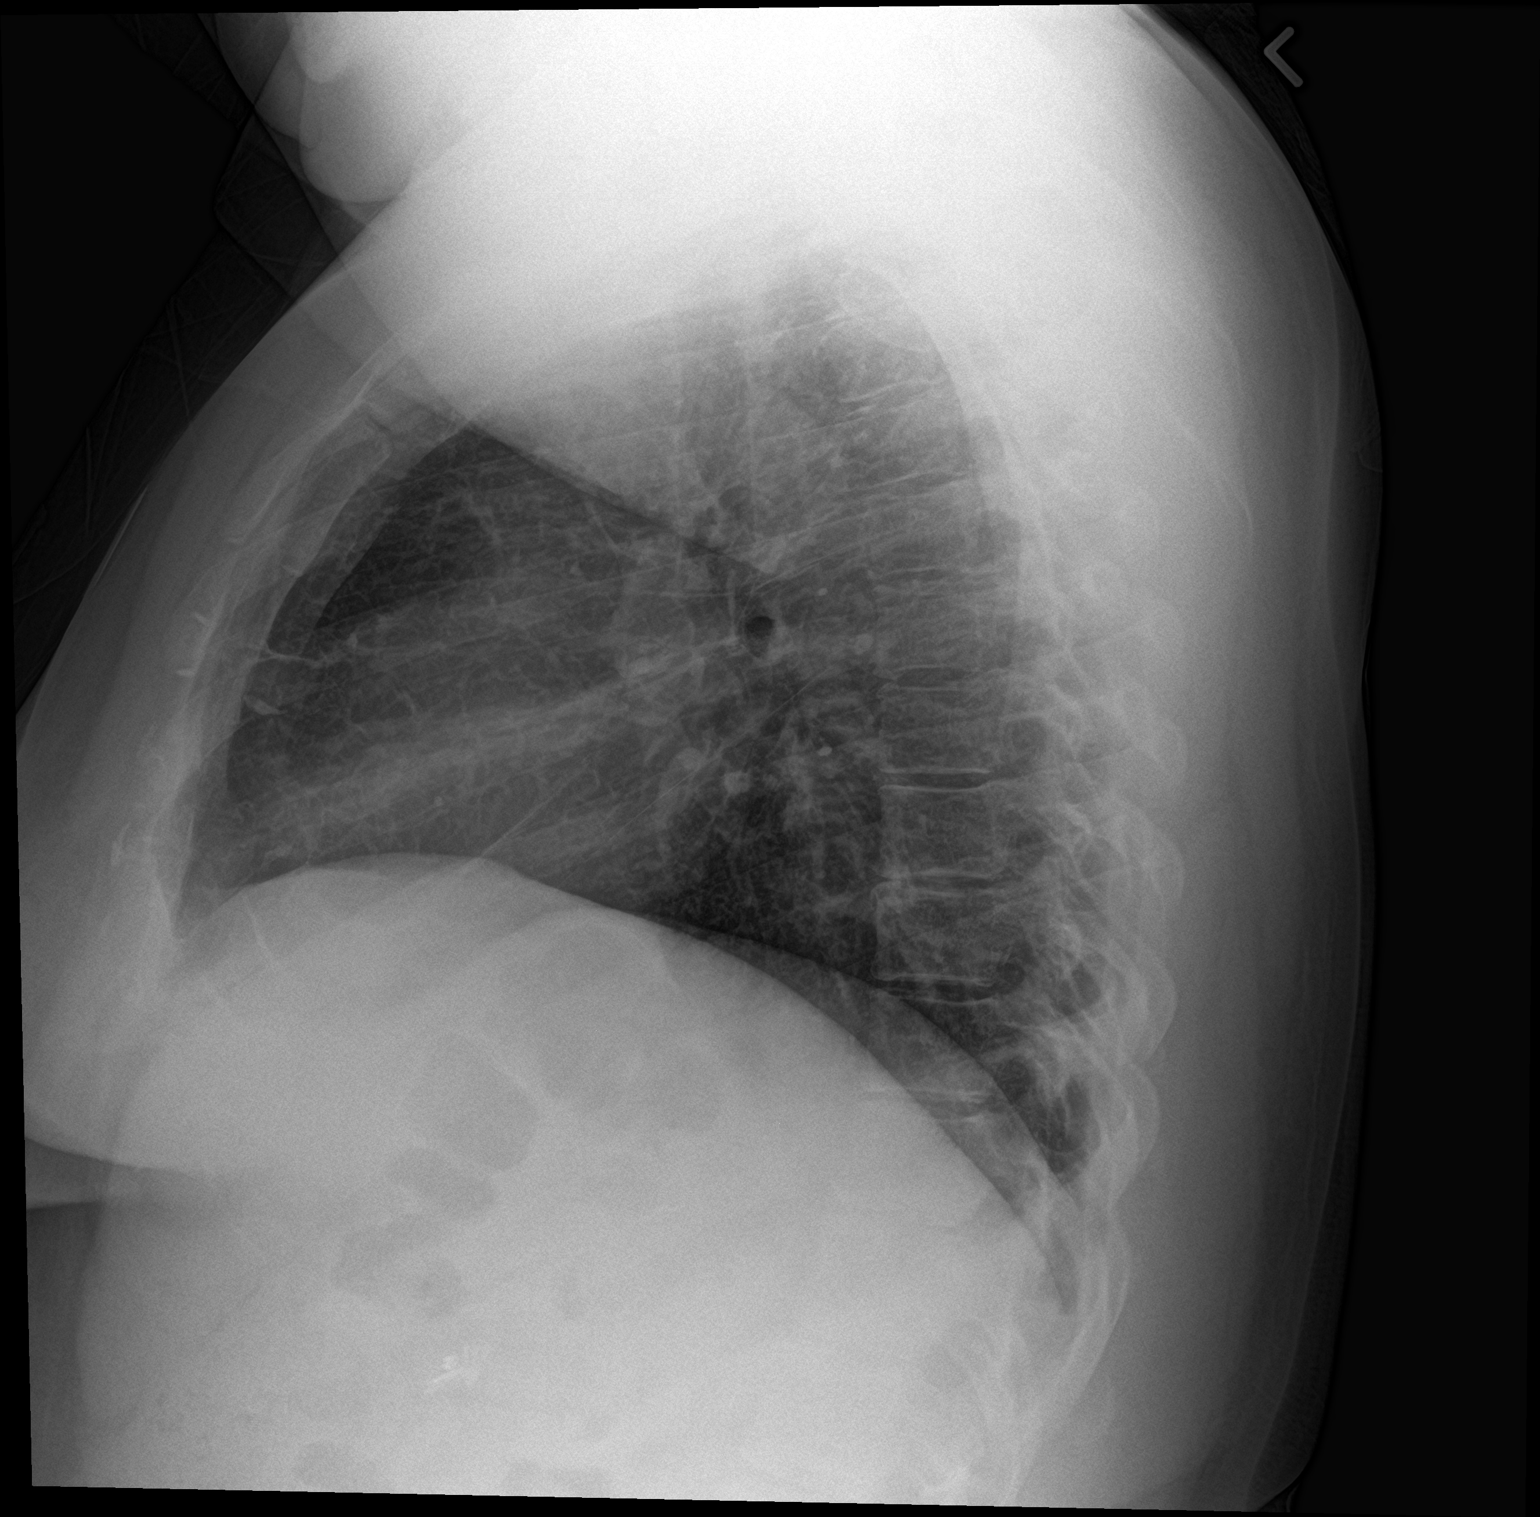

[2 of 2 positions shown; findings below may reference images not displayed]

FINDINGS: The cardiomediastinal contours are normal. The lungs are clear.
Pulmonary vasculature is normal. No consolidation, pleural effusion,
or pneumothorax. No acute osseous abnormalities are seen.
IMPRESSION: No acute pulmonary process.

## 2019-05-20 ENCOUNTER — Telehealth: Payer: Self-pay

## 2019-05-20 NOTE — Telephone Encounter (Signed)
Called on 9/30 at 2:31pm and left vm about scheduling a fu appt and updating eligibility

## 2021-05-30 LAB — HEMOGLOBIN A1C
Estimated Avg Glucose, External: 130 mg/dL — ABNORMAL HIGH (ref 91–123)
Hemoglobin A1C, External: 6.2 % — ABNORMAL HIGH (ref 4.8–5.6)

## 2022-04-06 LAB — HEMOGLOBIN A1C
Estimated Avg Glucose, External: 129 mg/dL — ABNORMAL HIGH (ref 91–123)
Hemoglobin A1C, External: 6.1 % — ABNORMAL HIGH (ref 4.8–5.6)

## 2022-10-03 NOTE — Telephone Encounter (Signed)
[facility]    Received call from [facility]; Patient with Red Flag Complaint requesting to establish care with [facility].    Subjective: Caller states "abdominal pain, swelling" Has been going to ED and gastro. Gertie Fey is booked out awhile.     Current Symptoms: Severe heartburn when not eating, swelling under right rib cage near sternum. Has had nausea and some loose stools. Lower right abdominal pain.     Onset: 6 months ago; worsening    Associated Symptoms: reduced activity, reduced appetite, diarrhea    Pain Severity: 6/10; sharp, aching; intermittent, can be constant at times    Temperature: denies fever     What has been tried: pepto, famotidine    LMP: NA Pregnant: NA    Recommended disposition: Go to ED Now, patient declined. She said she is not going to go sit and wait there, she has to go to work later.     Care advice provided, patient verbalizes understanding; denies any other questions or concerns; instructed to call back for any new or worsening symptoms.    Writer provided warm transfer to [facility] at [facility] for appointment scheduling.     Attention Provider:  Thank you for allowing me to participate in the care of your patient.  The patient was connected to triage in response to information provided to the ECC.  Please do not respond through this encounter as the response is not directed to a shared pool.    Reason for Disposition   SEVERE abdominal pain (e.g., excruciating)    Protocols used: Abdominal Pain - Female-ADULT-[place]

## 2022-10-23 ENCOUNTER — Encounter: Payer: MEDICAID | Attending: Gerontology

## 2024-07-08 ENCOUNTER — Emergency Department: Admit: 2024-07-08 | Payer: Medicaid (Managed Care)

## 2024-07-08 ENCOUNTER — Inpatient Hospital Stay
Admit: 2024-07-08 | Discharge: 2024-07-08 | Disposition: A | Discharge status: Home / Self Care | Payer: Medicaid (Managed Care) | Arrived: VH | Attending: Emergency Medicine

## 2024-07-08 DIAGNOSIS — M79662 Pain in left lower leg: Principal | ICD-10-CM

## 2024-07-08 LAB — EKG 12-LEAD
Atrial Rate: 86 {beats}/min
Calculated P Axis: 58 degrees
Calculated R Axis: 24 degrees
Calculated T Axis: 37 degrees
DIAGNOSIS, 93000: NORMAL
P-R Interval: 158 ms
Q-T Interval: 378 ms
QRS Duration: 82 ms
QTC Calculation (Bezet): 452 ms
Ventricular Rate: 86 {beats}/min

## 2024-07-08 LAB — CBC WITH AUTO DIFFERENTIAL
Basophils: 0.6 % (ref 0–3)
Eosinophils: 1.2 % (ref 0–5)
Hematocrit: 43.4 % (ref 35.0–47.0)
Hemoglobin: 14.7 g/dL (ref 11.0–16.0)
Immature Granulocytes %: 0.3 % (ref 0.0–3.0)
Lymphocytes: 21.8 % — ABNORMAL LOW (ref 28–48)
MCH: 28.6 pg (ref 25.4–34.6)
MCHC: 33.9 g/dL (ref 30.0–36.0)
MCV: 84.4 fL (ref 80.0–98.0)
MPV: 9 fL (ref 6.0–10.0)
Monocytes: 5.5 % (ref 1–13)
Neutrophils Segmented: 70.6 % — ABNORMAL HIGH (ref 34–64)
Nucleated RBCs: 0 (ref 0–0)
Platelets: 379 1000/mm3 (ref 140–450)
RBC: 5.14 M/uL (ref 3.60–5.20)
RDW: 35.8 — ABNORMAL LOW (ref 36.4–46.3)
WBC: 10.5 1000/mm3 (ref 4.0–11.0)

## 2024-07-08 LAB — BASIC METABOLIC PANEL
Anion Gap: 9 mmol/L (ref 5–15)
BUN: 18 mg/dL (ref 9–23)
CO2: 29 meq/L (ref 20–31)
Calcium: 10.2 mg/dL (ref 8.7–10.4)
Chloride: 102 meq/L (ref 98–107)
Creatinine: 0.88 mg/dL (ref 0.55–1.02)
GFR African American: >60
GFR Non-African American: >60
Glucose: 139 mg/dL — ABNORMAL HIGH (ref 74–106)
Potassium: 4 meq/L (ref 3.5–5.1)
Sodium: 140 meq/L (ref 136–145)

## 2024-07-08 LAB — D-DIMER, QUANTITATIVE: D-Dimer, Quant: 0.4 ug{FEU}/mL (ref 0.01–0.50)

## 2024-07-08 NOTE — ED Obs Progress Note (Signed)
"  4:07 PM  07/08/24     Discharge instructions given to patient with verbalization of understanding. Patient accompanied by patient.  Patient discharged with the following prescriptions      Medication List      You have not been prescribed any medications.     . Patient discharged to Home.      AMBER SLAGHT, RN    "

## 2024-07-08 NOTE — ED Triage Notes (Addendum)
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   "

## 2024-07-08 NOTE — ED Notes (Signed)
"  Pt brought back to room 22 from waiting room, PVL at bedside for scan.     Sheffield Vinie SAUNDERS, RN  07/08/24 1344    "

## 2024-07-08 NOTE — ED Provider Notes (Signed)
 "[facility]  Emergency Department Treatment Report        Patient: Judy Campbell Age: 53 y.o. Sex: female    Date of Birth: Jan 21, 1971 Admit Date: 07/08/2024 PCP: Graydon Domino, MD   MRN: [id number]  CSN: [id number]     Room: ER22/ER22 Time Dictated: 11:10 AM            Chief Complaint   Chief Complaint   Patient presents with    Leg Pain       History of Present Illness   This is a 53 y.o. female past medical history of DVT who presents with left calf pain.  Patient recently took a 16-hour trip to [place] ..  Patient is on Eliquis.  She has a history of a DVT patient has not missed any doses of her Eliquis but she developed calf pain that was associated with some left foot pain shooting down her calf and now is associated with proximal left thigh pain with basically her whole leg is painful there is some purpleish discoloration around her groin and yesterday her foot and lower leg was cold and painful.  She notes that this is similar to prior DVTs so she came in for further evaluation.  She has no chest tightness or shortness of breath   He also notes generalized headache which has been present since Saturday.    Review of Systems   As outlined in HPI    Past Medical/Surgical History   No past medical history on file.  No past surgical history on file.    Social History     Social History     Socioeconomic History    Marital status: Single     Spouse name: Not on file    Number of children: Not on file    Years of education: Not on file    Highest education level: Not on file   Occupational History    Not on file   Tobacco Use    Smoking status: Not on file    Smokeless tobacco: Not on file   Substance and Sexual Activity    Alcohol use: Never    Drug use: Not on file    Sexual activity: Not on file   Other Topics Concern    Not on file   Social History Narrative    Not on file     Social Drivers of Health     Financial Resource Strain: Patient Declined (01/15/2024)    Received from [facility]     Overall Financial Resource Strain (CARDIA)     Difficulty of Paying Living Expenses: Patient declined   Food Insecurity: Unknown (04/02/2024)    Received from [facility]    Hunger Vital Sign     Within the past 12 months, you worried that your food would run out before you got the money to buy more.: Never true     Ran Out of Food in the Last Year: Not on file   Transportation Needs: Unknown (04/02/2024)    Received from [facility] - Transportation     Lack of Transportation (Medical): No     Lack of Transportation (Non-Medical): Patient declined   Physical Activity: Patient Declined (04/25/2022)    Received from [facility]    Exercise Vital Sign     On average, how many days per week do you engage in moderate to strenuous exercise (like a brisk walk)?: Patient declined     On  average, how many minutes do you engage in exercise at this level?: Patient declined   Stress: No Stress Concern Present (04/25/2022)    Received from [facility] - Occupational Stress Questionnaire     Feeling of Stress : Not at all   Social Connections: Moderately Isolated (04/25/2022)    Received from [facility]    Social Connection and Isolation Panel     In a typical week, how many times do you talk on the phone with family, friends, or neighbors?: More than three times a week     How often do you get together with friends or relatives?: Once a week     How often do you attend church or religious services?: 1 to 4 times per year     Do you belong to any clubs or organizations such as church groups, unions, fraternal or athletic groups, or school groups?: No     How often do you attend meetings of the clubs or organizations you belong to?: Never     Are you married, widowed, divorced, separated, never married, or living with a partner?: Divorced   Intimate Partner Violence: Unknown (04/02/2024)    Received from [employer], Afraid, Rape, and  Kick questionnaire     Fear of Current or Ex-Partner: Not on file     Emotionally Abused: Not on file     Within the last year, have you been kicked, hit, slapped, or otherwise physically hurt by your partner or ex-partner?: No     Sexually Abused: Not on file   Housing Stability: Unknown (04/02/2024)    Received from [facility] Stability Vital Sign     In the last 12 months, was there a time when you were not able to pay the mortgage or rent on time?: No     Number of Times Moved in the Last Year: Not on file     Homeless in the Last Year: Not on file       Family History   No family history on file.    Current Medications     No current facility-administered medications for this encounter.     No current outpatient medications on file.       Allergies     Allergies   Allergen Reactions    Amoxicillin     Bactrim [Sulfamethoxazole-Trimethoprim]     Fentanyl     Penicillins        Physical Exam   No data found.    Physical Exam  Constitutional:       Appearance: Normal appearance.   HENT:      Head: Normocephalic and atraumatic.      Nose: Nose normal.      Mouth/Throat:      Mouth: Mucous membranes are moist.   Eyes:      Conjunctiva/sclera: Conjunctivae normal.      Pupils: Pupils are equal, round, and reactive to light.   Cardiovascular:      Rate and Rhythm: Normal rate.      Pulses: Normal pulses.   Pulmonary:      Effort: Pulmonary effort is normal.   Abdominal:      General: Abdomen is flat.   Musculoskeletal:         General: Normal range of motion.      Cervical back: Normal range of motion and neck supple.   Skin:  General: Skin is warm and dry.      Capillary Refill: Capillary refill takes less than 2 seconds.   Neurological:      General: No focal deficit present.      Mental Status: She is alert.   Psychiatric:         Mood and Affect: Mood normal.         Thought Content: Thought content normal.     Exam of the inguinal region done bedside with nursing present.  She has intertrigo  along the abdominal fold     Impression and Management Plan   RECORDS REVIEWED:  I reviewed the patient's previous records here at [facility] and available outside facilities and note that history of diabetes bilateral PE    EXTERNAL RESULTS REVIEWED: Follows outpatient with pain management and Ortho OB internal medicine    INDEPENDENT HISTORIAN:  History and/or plan development assisted by: Patient    Severe exacerbation or progression of chronic illness: Leg pain      Threat to body function without evaluation and management:  Loss of life or limb      SOCIAL DETERMINANTS  impacting Evaluation and Management:      Comorbidities impacting Evaluation and Management: History of DVT    Differential diagnoses leg pain DVT arterial verse venous occlusion    Initial impression: Patient with a extensive history of DVT check duplex    Diagnostic Studies   Lab:   Results for orders placed or performed during the hospital encounter of 07/08/24   CBC with Auto Differential   Result Value Ref Range    WBC 10.5 4.0 - 11.0 1000/mm3    RBC 5.14 3.60 - 5.20 M/uL    Hemoglobin 14.7 11.0 - 16.0 gm/dl    Hematocrit 56.5 64.9 - 47.0 %    MCV 84.4 80.0 - 98.0 fL    MCH 28.6 25.4 - 34.6 pg    MCHC 33.9 30.0 - 36.0 gm/dl    Platelets 620 859 - 450 1000/mm3    MPV 9.0 6.0 - 10.0 fL    RDW 35.8 (L) 36.4 - 46.3      Nucleated RBCs 0 0 - 0      Immature Granulocytes % 0.3 0.0 - 3.0 %    Neutrophils Segmented 70.6 (H) 34 - 64 %    Lymphocytes 21.8 (L) 28 - 48 %    Monocytes 5.5 1 - 13 %    Eosinophils 1.2 0 - 5 %    Basophils 0.6 0 - 3 %   Basic Metabolic Panel   Result Value Ref Range    Potassium 4.0 3.5 - 5.1 mEq/L    Chloride 102 98 - 107 mEq/L    Sodium 140 136 - 145 mEq/L    CO2 29 20 - 31 mEq/L    Glucose 139 (H) 74 - 106 mg/dl    BUN 18 9 - 23 mg/dl    Creatinine 9.11 9.44 - 1.02 mg/dl    GFR African American >60.0      GFR Non-African American >60      Calcium 10.2 8.7 - 10.4 mg/dl    Anion Gap 9 5 - 15 mmol/L   D-Dimer, Quantitative    Result Value Ref Range    D-Dimer, Quant 0.40 0.01 - 0.50 ug/mL (FEU)   EKG 12 Lead   Result Value Ref Range    Ventricular Rate 86 BPM    Atrial Rate 86 BPM    P-R Interval  158 ms    QRS Duration 82 ms    Q-T Interval 378 ms    QTC Calculation (Bezet) 452 ms    Calculated P Axis 58 degrees    Calculated R Axis 24 degrees    Calculated T Axis 37 degrees    DIAGNOSIS, 93000       Normal sinus rhythm  Poor R Wave Progression  No previous ECGs available  Confirmed by Synthia, Dhananjai ([id number]) on 07/08/2024 5:17:07 PM       Imaging:    VL DUP LOWER EXTREMITY VENOUS LEFT   Final Result      XR CHEST (2 VW)   Final Result   IMPRESSION:        No radiographic evidence of acute cardiopulmonary disease.      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  She has been compliant with her Eliquis has not missed any doses.  Dimer was actually negative but by [place] DVT she was actually high risk so we did the duplex which was also negative.  She also had some discoloration of her inguinal region that is intertrigo.  She is treating at home with miconazole also advised zinc cream as needed.      Medical Decision Making   Above      Final Diagnosis       ICD-10-CM    1. Left leg pain  M79.605               Disposition   Charge      Ozell Gentry, MD  July 14, 2024    My signature above authenticates this document and my orders, the final    diagnosis (es), discharge  prescription (s), and instructions in the Epic    record.  If you have any questions please contact [phone]     Nursing notes have been reviewed by the physician/ advanced practice    Clinician.       Gentry Ozell BRAVO, MD  07/14/24 [phone]    "

## 2024-07-08 NOTE — ED Notes (Signed)
"  Pedal pulse located on Left leg, pulse rate noted to be 78 bpm     Judy Campbell A  07/08/24 1207    "

## 2024-07-08 NOTE — Progress Notes (Signed)
"  Peripheral Vascular Lab Preliminary : Left Lower Extremity Venous Duplex     1. No evidence of deep vein thrombosis noted in the left lower extremity     Final report to follow  Thersia Gull RVS  "
